# Patient Record
Sex: Male | Born: 1985
Health system: Southern US, Community
[De-identification: ages and names within clinical notes are randomized; demographics above are authoritative.]

## PROBLEM LIST (undated history)

## (undated) DIAGNOSIS — R011 Cardiac murmur, unspecified: Secondary | ICD-10-CM

## (undated) HISTORY — DX: Cardiac murmur, unspecified: R01.1

---
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
## 2005-02-16 ENCOUNTER — Other Ambulatory Visit: Admission: RE | Admit: 2005-02-16 | Discharge: 2005-02-16 | Payer: Self-pay | Admitting: Dermatology

## 2015-03-10 ENCOUNTER — Emergency Department (HOSPITAL_COMMUNITY)
Admission: EM | Admit: 2015-03-10 | Discharge: 2015-03-11 | Disposition: A | Discharge status: Home / Self Care | Payer: 59 | Attending: Emergency Medicine | Admitting: Emergency Medicine

## 2015-03-10 ENCOUNTER — Encounter (HOSPITAL_COMMUNITY): Payer: Self-pay | Admitting: Emergency Medicine

## 2015-03-10 DIAGNOSIS — R011 Cardiac murmur, unspecified: Secondary | ICD-10-CM | POA: Diagnosis not present

## 2015-03-10 DIAGNOSIS — M25562 Pain in left knee: Secondary | ICD-10-CM

## 2015-03-10 MED ORDER — HYDROCODONE-ACETAMINOPHEN 5-325 MG PO TABS
1.0000 | ORAL_TABLET | Freq: Once | ORAL | Status: AC
  Administered 2015-03-11: 1 via ORAL
  Filled 2015-03-10: qty 1

## 2015-03-10 NOTE — ED Notes (Signed)
Pt st's after going on a walk tonight he developed a sharp pain behind left knee then left leg started to feel numb.

## 2015-03-10 NOTE — ED Provider Notes (Signed)
CSN: [id number]     Arrival date & time 03/10/15  1931 History  This chart was scribed for Merryl Hacker, MD by Rayfield Citizen, ED Scribe. This patient was seen in room D30C/D30C and the patient's care was started at 11:41 PM.    Chief Complaint  Patient presents with  . Knee Pain   The history is provided by the patient. No language interpreter was used.     HPI Comments: Eugene Aguilar is an otherwise healthy 29 y.o. male who presents to the Emergency Department complaining of sudden onset, "sharp and tight" pain behind his left knee and radiating up both sides of the knee that began while bending downward tonight after a walk. He rates his pain as a 4-5/10 at present; he notes some numbness to the lower leg that has gradually improved with time. His discomfort is unaffected with movement of the knee; he has been able to bear weight since the incident. No treatments or medications tried PTA. Patient denies back pain. He denies a personal history of blood clots.   No daily medications, no known allergies.   History reviewed. No pertinent past medical history. History reviewed. No pertinent past surgical history. No family history on file. History  Substance Use Topics  . Smoking status: Never Smoker   . Smokeless tobacco: Not on file  . Alcohol Use: Yes    Review of Systems  Musculoskeletal:       Left knee pain  Neurological: Positive for numbness. Negative for weakness.  All other systems reviewed and are negative.     Allergies  Review of patient's allergies indicates no known allergies.  Home Medications   Prior to Admission medications   Medication Sig Start Date End Date Taking? Authorizing Provider  HYDROcodone-acetaminophen (NORCO/VICODIN) 5-325 MG per tablet Take 1 tablet by mouth every 6 (six) hours as needed for moderate pain. 03/11/15   Merryl Hacker, MD  ibuprofen (ADVIL,MOTRIN) 600 MG tablet Take 1 tablet (600 mg total) by mouth every 6 (six) hours as  needed. 03/11/15   Merryl Hacker, MD   BP 116/77 mmHg  Pulse 67  Temp(Src) 98.4 F (36.9 C) (Oral)  Resp 18  SpO2 97% Physical Exam  Constitutional: He is oriented to person, place, and time. He appears well-developed and well-nourished.  HENT:  Head: Normocephalic and atraumatic.  Cardiovascular: Normal rate and regular rhythm.   Murmur heard. Pulmonary/Chest: Effort normal and breath sounds normal. No respiratory distress. He has no wheezes.  Musculoskeletal: He exhibits no edema.  Tenderness to palpation of the left popliteal fossa, mild fullness noted, no asymmetric swelling, no joint line tenderness, no laxity of the joint, no obvious effusion, no overlying skin changes  Lymphadenopathy:    He has no cervical adenopathy.  Neurological: He is alert and oriented to person, place, and time.  Skin: Skin is warm and dry.  Psychiatric: He has a normal mood and affect.  Nursing note and vitals reviewed.   ED Course  Procedures   DIAGNOSTIC STUDIES: Oxygen Saturation is 100% on RA, normal by my interpretation.    COORDINATION OF CARE: 11:46 PM Discussed treatment plan with pt at bedside and pt agreed to plan.   Labs Review Labs Reviewed  D-DIMER, QUANTITATIVE (NOT AT [facility])    Imaging Review Dg Knee Complete 4 Views Left  03/11/2015   CLINICAL DATA:  Left knee pain. No known injury. Unable to bear weight.  EXAM: LEFT KNEE - COMPLETE 4+ VIEW  COMPARISON:  None.  FINDINGS: No fracture or dislocation. The alignment and joint spaces are maintained. No joint effusion or focal soft tissue abnormality.  IMPRESSION: Negative.   Electronically Signed   By: Jeb Levering M.D.   On: 03/11/2015 00:56     EKG Interpretation None      MDM   Final diagnoses:  Left knee pain   Patient presents with left knee pain. Also reports transient sharp tingling sensation in the setting of pain. Nontoxic on exam. Exam is only notable for mild tenderness of the popliteal fossa. X-rays  negative. Screening d-dimer is reassuring. Suspect possible Baker's cyst.  Discuss with patient pain medication and anti-inflammatories.  Patient to follow with PCP if symptoms continue over the next several days.  After history, exam, and medical workup I feel the patient has been appropriately medically screened and is safe for discharge home. Pertinent diagnoses were discussed with the patient. Patient was given return precautions.  I personally performed the services described in this documentation, which was scribed in my presence. The recorded information has been reviewed and is accurate.      Merryl Hacker, MD 03/12/15 [phone]

## 2015-03-11 ENCOUNTER — Emergency Department (HOSPITAL_COMMUNITY): Payer: 59

## 2015-03-11 LAB — D-DIMER, QUANTITATIVE (NOT AT ARMC)

## 2015-03-11 MED ORDER — IBUPROFEN 600 MG PO TABS: 600.0000 mg | ORAL_TABLET | Freq: Four times a day (QID) | ORAL | Status: DC | PRN

## 2015-03-11 MED ORDER — HYDROCODONE-ACETAMINOPHEN 5-325 MG PO TABS: 1.0000 | ORAL_TABLET | Freq: Four times a day (QID) | ORAL | Status: DC | PRN

## 2015-03-11 NOTE — ED Notes (Signed)
Pt stable, ambulatory, states understanding of discharge instructions 

## 2015-03-11 NOTE — Discharge Instructions (Signed)
You were seen today for knee pain. Her x-ray is reassuring. Your screening test for blood clot is negative. You may have a Baker cyst.  Treatment is with anti-inflammatory medication and rest and ice as needed. Follow-up with her primary physician if symptoms do not improve.  Knee Pain The knee is the complex joint between your thigh and your lower leg. It is made up of bones, tendons, ligaments, and cartilage. The bones that make up the knee are:  The femur in the thigh.  The tibia and fibula in the lower leg.  The patella or kneecap riding in the groove on the lower femur. CAUSES  Knee pain is a common complaint with many causes. A few of these causes are:  Injury, such as:  A ruptured ligament or tendon injury.  Torn cartilage.  Medical conditions, such as:  Gout  Arthritis  Infections  Overuse, over training, or overdoing a physical activity. Knee pain can be minor or severe. Knee pain can accompany debilitating injury. Minor knee problems often respond well to self-care measures or get well on their own. More serious injuries may need medical intervention or even surgery. SYMPTOMS The knee is complex. Symptoms of knee problems can vary widely. Some of the problems are:  Pain with movement and weight bearing.  Swelling and tenderness.  Buckling of the knee.  Inability to straighten or extend your knee.  Your knee locks and you cannot straighten it.  Warmth and redness with pain and fever.  Deformity or dislocation of the kneecap. DIAGNOSIS  Determining what is wrong may be very straight forward such as when there is an injury. It can also be challenging because of the complexity of the knee. Tests to make a diagnosis may include:  Your caregiver taking a history and doing a physical exam.  Routine X-rays can be used to rule out other problems. X-rays will not reveal a cartilage tear. Some injuries of the knee can be diagnosed by:  Arthroscopy a surgical  technique by which a small video camera is inserted through tiny incisions on the sides of the knee. This procedure is used to examine and repair internal knee joint problems. Tiny instruments can be used during arthroscopy to repair the torn knee cartilage (meniscus).  Arthrography is a radiology technique. A contrast liquid is directly injected into the knee joint. Internal structures of the knee joint then become visible on X-ray film.  An MRI scan is a non X-ray radiology procedure in which magnetic fields and a computer produce two- or three-dimensional images of the inside of the knee. Cartilage tears are often visible using an MRI scanner. MRI scans have largely replaced arthrography in diagnosing cartilage tears of the knee.  Blood work.  Examination of the fluid that helps to lubricate the knee joint (synovial fluid). This is done by taking a sample out using a needle and a syringe. TREATMENT The treatment of knee problems depends on the cause. Some of these treatments are:  Depending on the injury, proper casting, splinting, surgery, or physical therapy care will be needed.  Give yourself adequate recovery time. Do not overuse your joints. If you begin to get sore during workout routines, back off. Slow down or do fewer repetitions.  For repetitive activities such as cycling or running, maintain your strength and nutrition.  Alternate muscle groups. For example, if you are a weight lifter, work the upper body on one day and the lower body the next.  Either tight or weak muscles  do not give the proper support for your knee. Tight or weak muscles do not absorb the stress placed on the knee joint. Keep the muscles surrounding the knee strong.  Take care of mechanical problems.  If you have flat feet, orthotics or special shoes may help. See your caregiver if you need help.  Arch supports, sometimes with wedges on the inner or outer aspect of the heel, can help. These can shift  pressure away from the side of the knee most bothered by osteoarthritis.  A brace called an "unloader" brace also may be used to help ease the pressure on the most arthritic side of the knee.  If your caregiver has prescribed crutches, braces, wraps or ice, use as directed. The acronym for this is PRICE. This means protection, rest, ice, compression, and elevation.  Nonsteroidal anti-inflammatory drugs (NSAIDs), can help relieve pain. But if taken immediately after an injury, they may actually increase swelling. Take NSAIDs with food in your stomach. Stop them if you develop stomach problems. Do not take these if you have a history of ulcers, stomach pain, or bleeding from the bowel. Do not take without your caregiver's approval if you have problems with fluid retention, heart failure, or kidney problems.  For ongoing knee problems, physical therapy may be helpful.  Glucosamine and chondroitin are over-the-counter dietary supplements. Both may help relieve the pain of osteoarthritis in the knee. These medicines are different from the usual anti-inflammatory drugs. Glucosamine may decrease the rate of cartilage destruction.  Injections of a corticosteroid drug into your knee joint may help reduce the symptoms of an arthritis flare-up. They may provide pain relief that lasts a few months. You may have to wait a few months between injections. The injections do have a small increased risk of infection, water retention, and elevated blood sugar levels.  Hyaluronic acid injected into damaged joints may ease pain and provide lubrication. These injections may work by reducing inflammation. A series of shots may give relief for as long as 6 months.  Topical painkillers. Applying certain ointments to your skin may help relieve the pain and stiffness of osteoarthritis. Ask your pharmacist for suggestions. Many over the-counter products are approved for temporary relief of arthritis pain.  In some countries,  doctors often prescribe topical NSAIDs for relief of chronic conditions such as arthritis and tendinitis. A review of treatment with NSAID creams found that they worked as well as oral medications but without the serious side effects. PREVENTION  Maintain a healthy weight. Extra pounds put more strain on your joints.  Get strong, stay limber. Weak muscles are a common cause of knee injuries. Stretching is important. Include flexibility exercises in your workouts.  Be smart about exercise. If you have osteoarthritis, chronic knee pain or recurring injuries, you may need to change the way you exercise. This does not mean you have to stop being active. If your knees ache after jogging or playing basketball, consider switching to swimming, water aerobics, or other low-impact activities, at least for a few days a week. Sometimes limiting high-impact activities will provide relief.  Make sure your shoes fit well. Choose footwear that is right for your sport.  Protect your knees. Use the proper gear for knee-sensitive activities. Use kneepads when playing volleyball or laying carpet. Buckle your seat belt every time you drive. Most shattered kneecaps occur in car accidents.  Rest when you are tired. SEEK MEDICAL CARE IF:  You have knee pain that is continual and does not seem  to be getting better.  SEEK IMMEDIATE MEDICAL CARE IF:  Your knee joint feels hot to the touch and you have a high fever. MAKE SURE YOU:   Understand these instructions.  Will watch your condition.  Will get help right away if you are not doing well or get worse. Document Released: 06/26/2007 Document Revised: 11/21/2011 Document Reviewed: 06/26/2007 [facility] Patient Information 2015 [place], [place]. This information is not intended to replace advice given to you by your health care provider. Make sure you discuss any questions you have with your health care provider.

## 2016-03-01 ENCOUNTER — Encounter: Payer: Self-pay | Admitting: Cardiovascular Disease

## 2016-03-01 ENCOUNTER — Ambulatory Visit (INDEPENDENT_AMBULATORY_CARE_PROVIDER_SITE_OTHER): Payer: 59 | Admitting: Cardiovascular Disease

## 2016-03-01 ENCOUNTER — Encounter (INDEPENDENT_AMBULATORY_CARE_PROVIDER_SITE_OTHER): Payer: 59

## 2016-03-01 VITALS — BP 112/70 | HR 76 | Ht 69.0 in | Wt 143.8 lb

## 2016-03-01 DIAGNOSIS — R002 Palpitations: Secondary | ICD-10-CM

## 2016-03-01 DIAGNOSIS — Q210 Ventricular septal defect: Secondary | ICD-10-CM

## 2016-03-01 DIAGNOSIS — R0789 Other chest pain: Secondary | ICD-10-CM

## 2016-03-01 DIAGNOSIS — R079 Chest pain, unspecified: Secondary | ICD-10-CM

## 2016-03-01 DIAGNOSIS — Z0000 Encounter for general adult medical examination without abnormal findings: Secondary | ICD-10-CM

## 2016-03-01 NOTE — Assessment & Plan Note (Signed)
History of VSD which was a congenital heart defect from birth. He was seeing a pH of cardiologist at [facility]. He was very active and ran track at [employer]. He did have one episode of atypical chest pain but denies further episodes. Also denies shortness of breath. He briefly started running again.

## 2016-03-01 NOTE — Patient Instructions (Signed)
Medication Instructions:  Your physician recommends that you continue on your current medications as directed. Please refer to the Current Medication list given to you today.   Labwork: Your physician recommends that you return for lab work TODAY.   Testing/Procedures: Your physician has recommended that you wear an event monitor. Event monitors are medical devices that record the heart's electrical activity. Doctors most often [place] these monitors to diagnose arrhythmias. Arrhythmias are problems with the speed or rhythm of the heartbeat. The monitor is a small, portable device. You can wear one while you do your normal daily activities. This is usually used to diagnose what is causing palpitations/syncope (passing out).  Your physician has requested that you have an echocardiogram. Echocardiography is a painless test that uses sound waves to create images of your heart. It provides your doctor with information about the size and shape of your heart and how well your heart's chambers and valves are working. This procedure takes approximately one hour. There are no restrictions for this procedure.    Follow-Up: Your physician recommends that you schedule a follow-up appointment in: ABOUT 6 WEEKS AFTER YOUR ECHO AND EVENT MONITOR.   Any Other Special Instructions Will Be Listed Below (If Applicable).     If you need a refill on your cardiac medications before your next appointment, please call your pharmacy.

## 2016-03-01 NOTE — Assessment & Plan Note (Signed)
Recent onset of palpitations the last several weeks with presyncope.We will check routine lab work as well as a 30 day event monitor to further evaluate

## 2016-03-01 NOTE — Progress Notes (Signed)
     03/01/2016 Eugene Aguilar   05/06/1986  [id number]  Primary Physician No PCP Per Patient Primary Cardiologist: Lorretta Harp MD Renae Gloss  HPI:  Eugene Aguilar is a very pleasant 30 year old thin and fit-appearing married Caucasian male who is referred for evaluation of palpitations and chest pain. He works in the [employer] [place]. He has no cardiac risk factors. He was born with a VSD and was altered by PAS cardiologist at [facility]. He is very active and did run track in college. He briefly had an episode of atypical chest pain and has had residual low-grade chest discomfort along with episodic tachypalpitations with some associated presyncope.   No current outpatient prescriptions on file.   No current facility-administered medications for this visit.    No Known Allergies  Social History   Social History  . Marital Status: Single    Spouse Name: N/A  . Number of Children: N/A  . Years of Education: N/A   Occupational History  . Not on file.   Social History Main Topics  . Smoking status: Never Smoker   . Smokeless tobacco: Not on file  . Alcohol Use: Yes  . Drug Use: No  . Sexual Activity: Not on file   Other Topics Concern  . Not on file   Social History Narrative     Review of Systems: General: negative for chills, fever, night sweats or weight changes.  Cardiovascular: negative for chest pain, dyspnea on exertion, edema, orthopnea, palpitations, paroxysmal nocturnal dyspnea or shortness of breath Dermatological: negative for rash Respiratory: negative for cough or wheezing Urologic: negative for hematuria Abdominal: negative for nausea, vomiting, diarrhea, bright red blood per rectum, melena, or hematemesis Neurologic: negative for visual changes, syncope, or dizziness All other systems reviewed and are otherwise negative except as noted above.    Blood pressure 112/70, pulse 76, height 5\' 9"  (1.753 m), weight 143 lb  12.8 oz (65.227 kg).  General appearance: alert and no distress Neck: no adenopathy, no carotid bruit, no JVD, supple, symmetrical, trachea midline and thyroid not enlarged, symmetric, no tenderness/mass/nodules Lungs: clear to auscultation bilaterally Heart: 2 and fro systolic and diastolic "washing machine" murmur consistent with VSD Extremities: extremities normal, atraumatic, no cyanosis or edema  EKG normal sinus rhythm at 76 with an incomplete right bundle-branch block. I personally reviewed this EKG  ASSESSMENT AND PLAN:   Palpitations Recent onset of palpitations the last several weeks with presyncope.We will check routine lab work as well as a 30 day event monitor to further evaluate  VSD (ventricular septal defect) History of VSD which was a congenital heart defect from birth. He was seeing a pH of cardiologist at [facility]. He was very active and ran track at [employer]. He did have one episode of atypical chest pain but denies further episodes. Also denies shortness of breath. He briefly started running again.      Lorretta Harp MD FACP,FACC,FAHA, [facility] 03/01/2016 2:13 PM

## 2016-03-02 LAB — CBC WITH DIFFERENTIAL/PLATELET
Basophils Absolute: 42 cells/uL (ref 0–200)
Basophils Relative: 1 %
EOS ABS: 84 {cells}/uL (ref 15–500)
Eosinophils Relative: 2 %
HEMATOCRIT: 47.2 % (ref 38.5–50.0)
Hemoglobin: 15.6 g/dL (ref 13.2–17.1)
LYMPHS PCT: 33 %
Lymphs Abs: 1386 cells/uL (ref 850–3900)
MCH: 29.4 pg (ref 27.0–33.0)
MCHC: 33.1 g/dL (ref 32.0–36.0)
MCV: 89.1 fL (ref 80.0–100.0)
MONO ABS: 294 {cells}/uL (ref 200–950)
MPV: 8.7 fL (ref 7.5–12.5)
Monocytes Relative: 7 %
NEUTROS PCT: 57 %
Neutro Abs: 2394 cells/uL (ref 1500–7800)
Platelets: 196 10*3/uL (ref 140–400)
RBC: 5.3 MIL/uL (ref 4.20–5.80)
RDW: 13.4 % (ref 11.0–15.0)
WBC: 4.2 10*3/uL (ref 3.8–10.8)

## 2016-03-02 LAB — BASIC METABOLIC PANEL
BUN: 13 mg/dL (ref 7–25)
CO2: 31 mmol/L (ref 20–31)
Calcium: 9.5 mg/dL (ref 8.6–10.3)
Chloride: 102 mmol/L (ref 98–110)
Creat: 1.17 mg/dL (ref 0.60–1.35)
GLUCOSE: 68 mg/dL (ref 65–99)
POTASSIUM: 4.1 mmol/L (ref 3.5–5.3)
Sodium: 141 mmol/L (ref 135–146)

## 2016-03-02 LAB — T4, FREE: FREE T4: 1.3 ng/dL (ref 0.8–1.8)

## 2016-03-02 LAB — TSH: TSH: 1.23 mIU/L (ref 0.40–4.50)

## 2016-03-09 ENCOUNTER — Ambulatory Visit (HOSPITAL_COMMUNITY): Payer: 59 | Attending: Cardiology

## 2016-03-09 ENCOUNTER — Other Ambulatory Visit: Payer: Self-pay

## 2016-03-09 DIAGNOSIS — Q210 Ventricular septal defect: Secondary | ICD-10-CM | POA: Diagnosis not present

## 2016-03-09 LAB — ECHOCARDIOGRAM COMPLETE
AVLVOTPG: 3 mmHg
EERAT: 4.35
EWDT: 225 ms
FS: 34 % (ref 28–44)
IVS/LV PW RATIO, ED: 0.97
LA diam end sys: 27 mm
LA diam index: 1.52 cm/m2
LA vol A4C: 27 ml
LASIZE: 27 mm
LAVOL: 32 mL
LAVOLIN: 18 mL/m2
LV E/e' medial: 4.35
LV PW d: 8.68 mm — AB (ref 0.6–1.1)
LV e' LATERAL: 18.2 cm/s
LVEEAVG: 4.35
LVOT SV: 91 mL
LVOT VTI: 17.2 cm
LVOT area: 5.31 cm2
LVOT diameter: 26 mm
LVOTPV: 83.1 cm/s
MV Dec: 225
MV Peak grad: 3 mmHg
MVPKAVEL: 60.6 m/s
MVPKEVEL: 79.2 m/s
Reg peak vel: 243 cm/s
TDI e' lateral: 18.2
TDI e' medial: 12.7
TRMAXVEL: 243 cm/s

## 2016-03-11 ENCOUNTER — Ambulatory Visit: Payer: 59 | Admitting: Cardiovascular Disease

## 2016-04-20 ENCOUNTER — Telehealth: Payer: Self-pay | Admitting: Cardiovascular Disease

## 2016-04-20 NOTE — Telephone Encounter (Signed)
Returned call to patient and gave him results. Notes Recorded by Lorretta Harp, MD on 04/18/2016 at 1:09 PM EDT SR/ST  He verbalized understanding.

## 2016-04-20 NOTE — Telephone Encounter (Signed)
Mr. Eugene Aguilar is returning a call . Please call   Thanks

## 2016-04-26 ENCOUNTER — Encounter (INDEPENDENT_AMBULATORY_CARE_PROVIDER_SITE_OTHER): Payer: Self-pay

## 2016-04-26 ENCOUNTER — Encounter: Payer: Self-pay | Admitting: Cardiovascular Disease

## 2016-04-26 ENCOUNTER — Ambulatory Visit (INDEPENDENT_AMBULATORY_CARE_PROVIDER_SITE_OTHER): Payer: 59 | Admitting: Cardiovascular Disease

## 2016-04-26 DIAGNOSIS — R002 Palpitations: Secondary | ICD-10-CM | POA: Diagnosis not present

## 2016-04-26 DIAGNOSIS — Q210 Ventricular septal defect: Secondary | ICD-10-CM | POA: Diagnosis not present

## 2016-04-26 NOTE — Patient Instructions (Signed)
Your physician recommends that you schedule a follow-up appointment ON AN AS NEEDED BASIS.

## 2016-04-26 NOTE — Assessment & Plan Note (Signed)
Eugene Aguilar returns for follow-up of palpitations. His monitor showed sinus rhythm/sinus tachycardia without tachycardia or bradycardia arrhythmias. He did describe his initial symptom was sounded like PSVT. It awakened her from sleep, lasted for 5 hours and rapid offset afterward she felt fatigued. He has not had recurrent symptoms. His thyroid function tests were normal. If he has a recurrent episode he will need a loop recorder implantation.

## 2016-04-26 NOTE — Assessment & Plan Note (Signed)
History of VSD with 2-D echo that showed a small. Membranous VSD with normal LV size and function are normal RV size and function with normal pulmonary artery pressures.

## 2016-04-26 NOTE — Progress Notes (Signed)
Eugene Aguilar returns today for follow-up of his 2-D echo and event monitor. Monitor showed only sinus rhythm/sinus tachycardia without abnormal rhythms. His 2-D echo was entirely normal with demonstration of a small perimembranous VSD. I reassured him that in probable that he will have a recurrent episode but if he does need a loop recorder implanted further determine the nature of this arrhythmia. I'll see him back as needed.   Lorretta Harp, M.D., [place], [facility], [place] [address] [place], [place]  [id number]  [phone] 04/26/2016 8:28 AM

## 2016-07-06 ENCOUNTER — Telehealth: Payer: Self-pay | Admitting: Cardiovascular Disease

## 2016-07-06 NOTE — Telephone Encounter (Signed)
Called patient and gave him some recommendations for PCP.

## 2016-07-06 NOTE — Telephone Encounter (Signed)
New message     Patient is looking for a referral to PCP in [place].

## 2016-07-06 NOTE — Telephone Encounter (Signed)
Please advise if any recommendations/preferences for PCP.

## 2016-07-13 ENCOUNTER — Telehealth: Payer: Self-pay | Admitting: Cardiovascular Disease

## 2016-07-13 NOTE — Telephone Encounter (Signed)
Patient states he has had lingering, dull chest pain for a while.  It has gotten worse today and when he eats, he gets lightheaded.  Please call

## 2016-07-13 NOTE — Telephone Encounter (Signed)
Patient reporting dull pain, intermittent nausea x4-5 weeks States not worse exertionally. Discomfort mainly in upper chest area (just below throat), exacerbated w eating (states dizziness, nausea but no loss of appetite). He notes could be GERD, has not gone to ED for evaluation. Asked about any alleviation of his symptoms. He's cut out caffeine & drinking more water, notes no or little improvement.  Advised to go to ED if pain or dizziness worsens, or if he has accompanying symptoms such as SOB, arm or neck pain. Will see APP tomorrow.

## 2016-07-14 ENCOUNTER — Ambulatory Visit (INDEPENDENT_AMBULATORY_CARE_PROVIDER_SITE_OTHER): Payer: 59 | Admitting: Cardiology

## 2016-07-14 ENCOUNTER — Encounter: Payer: Self-pay | Admitting: Cardiology

## 2016-07-14 VITALS — BP 113/67 | HR 76 | Ht 69.0 in | Wt 143.6 lb

## 2016-07-14 DIAGNOSIS — R002 Palpitations: Secondary | ICD-10-CM | POA: Diagnosis not present

## 2016-07-14 DIAGNOSIS — R0789 Other chest pain: Secondary | ICD-10-CM | POA: Diagnosis not present

## 2016-07-14 DIAGNOSIS — Q210 Ventricular septal defect: Secondary | ICD-10-CM

## 2016-07-14 MED ORDER — OMEPRAZOLE 20 MG PO CPDR: 20.0000 mg | DELAYED_RELEASE_CAPSULE | Freq: Every day | ORAL | 11 refills | Status: DC

## 2016-07-14 NOTE — Patient Instructions (Signed)
Medication Instructions:  START OMEPRAZOLE 20MG  IN THE MORNING 30 MINUTES BEFORE BREAKFAST  Labwork: NONE  Follow-Up: Your physician recommends that you schedule a follow-up appointment in: [place]   If you need a refill on your cardiac medications before your next appointment, please call your pharmacy.

## 2016-07-14 NOTE — Assessment & Plan Note (Signed)
Pt here because he has had non exertional epigastric discomfort

## 2016-07-14 NOTE — Progress Notes (Signed)
    07/14/2016 Arna Snipe Eugene Aguilar   Aug 28, 1986  [id number]  Primary Physician No PCP Per Patient Primary Cardiologist: Dr Gwenlyn Found  HPI:  30 year old thin and fit-appearing married Caucasian male who is followed by Dr Gwenlyn Found. He was born with a VSD and was followed by a cardiologist at [facility]. He is very active and did run track at [employer] 2006-2010. He has had palpitations in the past, a Holter showed sinus tach in June 2017. Echo in June showed a small perimembranous VSD He takes no medication. He is in the office today with complaints of epigastric discomfort. The pt describes SSCP when his stomach is empty. He occasionally has dizziness when eating. He denies any exertional symptoms and walks/runs 3 x week. He denies any trouble swallowing. He denies any dyspnea.    Current Outpatient Prescriptions  Medication Sig Dispense Refill  . omeprazole (PRILOSEC) 20 MG capsule Take 1 capsule (20 mg total) by mouth daily. 30 capsule 11   No current facility-administered medications for this visit.     No Known Allergies  Social History   Social History  . Marital status: Married    Spouse name: N/A  . Number of children: N/A  . Years of education: N/A   Occupational History  . Not on file.   Social History Main Topics  . Smoking status: Never Smoker  . Smokeless tobacco: Not on file  . Alcohol use Yes  . Drug use: No  . Sexual activity: Not on file   Other Topics Concern  . Not on file   Social History Narrative  . No narrative on file     Review of Systems: General: negative for chills, fever, night sweats or weight changes.  Cardiovascular: negative for chest pain, dyspnea on exertion, edema, orthopnea, palpitations, paroxysmal nocturnal dyspnea or shortness of breath Dermatological: negative for rash Respiratory: negative for cough or wheezing Urologic: negative for hematuria Abdominal: negative for nausea, vomiting, diarrhea, bright red blood per rectum, melena,  or hematemesis Neurologic: negative for visual changes, syncope, or dizziness All other systems reviewed and are otherwise negative except as noted above.    Blood pressure 113/67, pulse 76, height 5\' 9"  (1.753 m), weight 143 lb 9.6 oz (65.1 kg), SpO2 99 %.  General appearance: alert, cooperative, no distress and thin Neck: no carotid bruit and no JVD Lungs: clear to auscultation bilaterally Heart: regular rate and rhythm and 2/6 mid systolic murmur Extremities: extremities normal, atraumatic, no cyanosis or edema Skin: Skin color, texture, turgor normal. No rashes or lesions Neurologic: Grossly normal  EKG NSR, NSST changes secondary to repol  ASSESSMENT AND PLAN:   Chest discomfort Pt here because he has had non exertional epigastric discomfort  Palpitations One episode of palpations since LOV- very brief  VSD (ventricular septal defect) Last echo June 2017   PLAN  He has no risk factors for CAD and his symptoms are more typical of GERD. I suggested we try Omeprazole 20 mg Q am. He'll call if his symptoms don't improve.   Kerin Ransom PA-C 07/14/2016 12:03 PM

## 2016-07-14 NOTE — Assessment & Plan Note (Signed)
One episode of palpations since LOV- very brief

## 2016-07-14 NOTE — Assessment & Plan Note (Signed)
Last echo June 2017

## 2016-12-28 DIAGNOSIS — L814 Other melanin hyperpigmentation: Secondary | ICD-10-CM | POA: Diagnosis not present

## 2016-12-28 DIAGNOSIS — D1800 Hemangioma unspecified site: Secondary | ICD-10-CM | POA: Diagnosis not present

## 2016-12-28 DIAGNOSIS — D225 Melanocytic nevi of trunk: Secondary | ICD-10-CM | POA: Diagnosis not present

## 2017-01-09 DIAGNOSIS — R600 Localized edema: Secondary | ICD-10-CM | POA: Diagnosis not present

## 2017-01-09 DIAGNOSIS — Z6820 Body mass index (BMI) 20.0-20.9, adult: Secondary | ICD-10-CM | POA: Diagnosis not present

## 2017-01-09 DIAGNOSIS — M79605 Pain in left leg: Secondary | ICD-10-CM | POA: Diagnosis not present

## 2017-01-09 DIAGNOSIS — R1032 Left lower quadrant pain: Secondary | ICD-10-CM | POA: Diagnosis not present

## 2017-03-14 DIAGNOSIS — Z0000 Encounter for general adult medical examination without abnormal findings: Secondary | ICD-10-CM | POA: Diagnosis not present

## 2017-03-20 DIAGNOSIS — Z23 Encounter for immunization: Secondary | ICD-10-CM | POA: Diagnosis not present

## 2017-03-20 DIAGNOSIS — Z6820 Body mass index (BMI) 20.0-20.9, adult: Secondary | ICD-10-CM | POA: Diagnosis not present

## 2017-03-20 DIAGNOSIS — Z0000 Encounter for general adult medical examination without abnormal findings: Secondary | ICD-10-CM | POA: Diagnosis not present

## 2017-07-07 DIAGNOSIS — Z23 Encounter for immunization: Secondary | ICD-10-CM | POA: Diagnosis not present

## 2017-11-23 DIAGNOSIS — J019 Acute sinusitis, unspecified: Secondary | ICD-10-CM | POA: Diagnosis not present

## 2017-12-07 DIAGNOSIS — J208 Acute bronchitis due to other specified organisms: Secondary | ICD-10-CM | POA: Diagnosis not present

## 2018-01-05 DIAGNOSIS — J45909 Unspecified asthma, uncomplicated: Secondary | ICD-10-CM | POA: Diagnosis not present

## 2018-01-05 DIAGNOSIS — R05 Cough: Secondary | ICD-10-CM | POA: Diagnosis not present

## 2018-01-05 DIAGNOSIS — J302 Other seasonal allergic rhinitis: Secondary | ICD-10-CM | POA: Diagnosis not present

## 2018-07-28 DIAGNOSIS — Z23 Encounter for immunization: Secondary | ICD-10-CM | POA: Diagnosis not present

## 2018-11-05 DIAGNOSIS — Z23 Encounter for immunization: Secondary | ICD-10-CM | POA: Diagnosis not present

## 2018-11-05 DIAGNOSIS — D225 Melanocytic nevi of trunk: Secondary | ICD-10-CM | POA: Diagnosis not present

## 2018-12-06 ENCOUNTER — Other Ambulatory Visit: Payer: Self-pay | Admitting: Internal Medicine

## 2018-12-06 ENCOUNTER — Other Ambulatory Visit (HOSPITAL_COMMUNITY): Payer: Self-pay | Admitting: Internal Medicine

## 2018-12-06 DIAGNOSIS — R002 Palpitations: Secondary | ICD-10-CM | POA: Diagnosis not present

## 2018-12-06 DIAGNOSIS — Q210 Ventricular septal defect: Secondary | ICD-10-CM | POA: Diagnosis not present

## 2018-12-06 DIAGNOSIS — R1110 Vomiting, unspecified: Secondary | ICD-10-CM | POA: Diagnosis not present

## 2018-12-07 ENCOUNTER — Other Ambulatory Visit: Payer: Self-pay

## 2018-12-07 ENCOUNTER — Telehealth: Payer: Self-pay | Admitting: Cardiovascular Disease

## 2018-12-07 ENCOUNTER — Other Ambulatory Visit: Payer: Self-pay | Admitting: Internal Medicine

## 2018-12-07 ENCOUNTER — Ambulatory Visit
Admission: RE | Admit: 2018-12-07 | Discharge: 2018-12-07 | Disposition: A | Discharge status: Home / Self Care | Payer: 59 | Source: Ambulatory Visit | Attending: Internal Medicine | Admitting: Internal Medicine

## 2018-12-07 DIAGNOSIS — R002 Palpitations: Secondary | ICD-10-CM

## 2018-12-07 DIAGNOSIS — S0990XA Unspecified injury of head, initial encounter: Secondary | ICD-10-CM

## 2018-12-07 DIAGNOSIS — R1110 Vomiting, unspecified: Secondary | ICD-10-CM

## 2018-12-07 DIAGNOSIS — R42 Dizziness and giddiness: Secondary | ICD-10-CM | POA: Diagnosis not present
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
## 2018-12-07 NOTE — Telephone Encounter (Signed)
The symptoms do not sound primarily cardiac nor do they sound urgent.  They sound more gastrointestinal.  I think for that episode he can probably follow-up with his PCP, wait for the 2D echo and be seen after that.

## 2018-12-07 NOTE — Telephone Encounter (Signed)
Patient c/o Palpitations:  High priority if patient c/o lightheadedness, shortness of breath, or chest pain  1) How long have you had palpitations/irregular HR/ Afib? Are you having the symptoms now? Started on 2) Wednesday, not at this time  3) Are you currently experiencing lightheadedness, SOB or CP? Preessure in his chest-now 4) Do you have a history of afib (atrial fibrillation) or irregular heart rhythm? no  5) Have you checked your BP or HR? (document readings if available): 117/70 -heart rate yesterday was 103  6) Are you experiencing any other symptoms?real tired, nauseated at timeshands and feet are cold to the touch,

## 2018-12-07 NOTE — Telephone Encounter (Signed)
Advised patient, verbalized understanding. Scheduled follow up, aware could change to virtual secondary to COVID 19

## 2018-12-27 ENCOUNTER — Telehealth: Payer: Self-pay | Admitting: Internal Medicine

## 2018-12-27 NOTE — Telephone Encounter (Signed)
Reviewed chart for echo orderded    Pt called in end of march   ? GI symptoms NOt sure echo still indicated since symptoms did not appear to be cardiac Will forward to Adora Fridge for review Pt to hold coming for echo until verified

## 2018-12-27 NOTE — Telephone Encounter (Signed)
I haven't seen this pt since 2017. Not sure who ordered 2D or why

## 2018-12-28 NOTE — Telephone Encounter (Signed)
Echo ordered on 12/06/2018 by a Dr. Velna Hatchet with indication listed as "Palpitations"

## 2018-12-29 NOTE — Telephone Encounter (Signed)
Pt had a 2D echo performed in 2017 that was essentially nl except small congenital VSD. This is NOT an urgent echo and can definitely be delayed.

## 2018-12-31 ENCOUNTER — Ambulatory Visit (HOSPITAL_COMMUNITY): Payer: 59
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
## 2019-01-01 ENCOUNTER — Telehealth: Payer: Self-pay | Admitting: *Deleted

## 2019-01-01 NOTE — Telephone Encounter (Signed)
   Cardiac Questionnaire:    Since your last visit or hospitalization:    1. Have you been having new or worsening chest pain? NO   2. Have you been having new or worsening shortness of breath? NO 3. Have you been having new or worsening leg swelling, wt gain, or increase in abdominal girth (pants fitting more tightly)? NO   4. Have you had any passing out spells? NO    *A YES to any of these questions would result in the appointment being kept. *If all the answers to these questions are NO, we should indicate that given the current situation regarding the worldwide coronarvirus pandemic, at the recommendation of the CDC, we are looking to limit gatherings in our waiting area, and thus will reschedule their appointment beyond four weeks from today.   _____________   PYPPJ-09 Pre-Screening Questions:  . Do you currently have a fever?NO . Have you recently travelled on a cruise, internationally, or to [place], [place], [place], [place], [place], or [place], [place] [employer])? NO . Have you been in contact with someone that is currently pending confirmation of Covid19 testing or has been confirmed to have the [place] virus?  NO . Are you currently experiencing fatigue or cough? NO       Spoke with patient and he agreed to reschedule his appointment to May 07, 2019 at 8:00 am with Dr. Gwenlyn Found.

## 2019-01-02 ENCOUNTER — Ambulatory Visit: Payer: 59 | Admitting: Cardiovascular Disease

## 2019-01-22 DIAGNOSIS — K219 Gastro-esophageal reflux disease without esophagitis: Secondary | ICD-10-CM | POA: Diagnosis not present

## 2019-02-27 ENCOUNTER — Telehealth (HOSPITAL_COMMUNITY): Payer: Self-pay | Admitting: Radiology

## 2019-02-27 NOTE — Telephone Encounter (Signed)
Left message to call office-Patient needs to schedule an echocardiogram.  

## 2019-03-07 ENCOUNTER — Telehealth (HOSPITAL_COMMUNITY): Payer: Self-pay | Admitting: Cardiology

## 2019-03-07 NOTE — Telephone Encounter (Signed)

## 2019-03-08 ENCOUNTER — Ambulatory Visit (HOSPITAL_COMMUNITY): Payer: 59 | Attending: Internal Medicine

## 2019-03-08 ENCOUNTER — Other Ambulatory Visit: Payer: Self-pay

## 2019-03-08 DIAGNOSIS — R002 Palpitations: Secondary | ICD-10-CM | POA: Diagnosis not present

## 2019-03-27 ENCOUNTER — Encounter: Payer: Self-pay | Admitting: Cardiology

## 2019-03-29 ENCOUNTER — Encounter: Payer: Self-pay | Admitting: Cardiology

## 2019-03-29 ENCOUNTER — Ambulatory Visit: Payer: 59 | Admitting: Cardiology

## 2019-03-29 ENCOUNTER — Other Ambulatory Visit: Payer: Self-pay

## 2019-03-29 VITALS — BP 118/82 | HR 88 | Ht 70.0 in | Wt 140.3 lb

## 2019-03-29 DIAGNOSIS — I471 Supraventricular tachycardia: Secondary | ICD-10-CM | POA: Diagnosis not present

## 2019-03-29 DIAGNOSIS — R002 Palpitations: Secondary | ICD-10-CM | POA: Diagnosis not present

## 2019-03-29 DIAGNOSIS — Q210 Ventricular septal defect: Secondary | ICD-10-CM | POA: Diagnosis not present

## 2019-03-29 NOTE — Progress Notes (Signed)
Primary Physician/Referring:  Velna Hatchet, MD  Patient ID: Eugene Aguilar, male    DOB: Jul 09, 1986, 33 y.o.   MRN: [id number]  Chief Complaint  Patient presents with  . Palpitations  . New Patient (Initial Visit)   HPI:    HPI: Eugene Aguilar  is a 33 y.o. male  With history of VSD since childhood, referred to me for evaluation of palpitations that occurred about 2 years ago, patient had an episode of rapid heartbeat about 2 years ago and subsided after few hours and was seen in the emergency room but had converted to sinus rhythm.  He is doing well until 3 months ago he had another episode of sudden onset rapid palpitations while he was just relaxing on the sofa, he tried Valsalva maneuver but would not respond, but due to Covid-19, although it lasted for about 5 hours he did not seek any attention, subsided spontaneously it left him very fatigued and tired.  He has not had any recurrence since then. Asymptomatic now.  He is fairly active, exercises regularly, he does not smoke cigarettes or drink excessive alcohol and there is no history of drug use.  No history of sudden cardiac death in the family.  Presently not on any medications.  He was told to have normal labs including lipids and TSH.  Past Medical History:  Diagnosis Date  . Heart murmur    VSD since childhood   History reviewed. No pertinent surgical history. Social History   Socioeconomic History  . Marital status: Married    Spouse name: Not on file  . Number of children: 1  . Years of education: Not on file  . Highest education level: Not on file  Occupational History  . Not on file  Social Needs  . Financial resource strain: Not on file  . Food insecurity    Worry: Not on file    Inability: Not on file  . Transportation needs    Medical: Not on file    Non-medical: Not on file  Tobacco Use  . Smoking status: Never Smoker  . Smokeless tobacco: Never Used  Substance and Sexual Activity  .  Alcohol use: Not Currently    Comment: quit march 2020  . Drug use: No  . Sexual activity: Never  Lifestyle  . Physical activity    Days per week: Not on file    Minutes per session: Not on file  . Stress: Not on file  Relationships  . Social Herbalist on phone: Not on file    Gets together: Not on file    Attends religious service: Not on file    Active member of club or organization: Not on file    Attends meetings of clubs or organizations: Not on file    Relationship status: Not on file  . Intimate partner violence    Fear of current or ex partner: Not on file    Emotionally abused: Not on file    Physically abused: Not on file    Forced sexual activity: Not on file  Other Topics Concern  . Not on file  Social History Narrative  . Not on file   ROS  Review of Systems  Constitution: Negative for chills, decreased appetite, malaise/fatigue and weight gain.  Cardiovascular: Negative for dyspnea on exertion, leg swelling and syncope.  Endocrine: Negative for cold intolerance.  Hematologic/Lymphatic: Does not bruise/bleed easily.  Musculoskeletal: Negative for joint swelling.  Gastrointestinal: Negative for abdominal  pain, anorexia, change in bowel habit, hematochezia and melena.  Neurological: Negative for headaches and light-headedness.  Psychiatric/Behavioral: Negative for depression and substance abuse.  All other systems reviewed and are negative.  Objective  Blood pressure 118/82, pulse 88, height 5\' 10"  (1.778 m), weight 140 lb 4.8 oz (63.6 kg), SpO2 98 %. Body mass index is 20.13 kg/m.   Physical Exam  Constitutional: He appears well-developed and well-nourished. No distress.  HENT:  Head: Atraumatic.  Eyes: Conjunctivae are normal.  Neck: Neck supple. No JVD present. No thyromegaly present.  Cardiovascular: Normal rate, regular rhythm, normal heart sounds and intact distal pulses. Exam reveals no gallop.  No murmur heard. Pulmonary/Chest: Effort  normal and breath sounds normal.  Abdominal: Soft. Bowel sounds are normal.  Musculoskeletal: Normal range of motion.  Neurological: He is alert.  Skin: Skin is warm and dry.  Psychiatric: He has a normal mood and affect.   Radiology: No results found.  Laboratory examination:   CMP Latest Ref Rng & Units 03/01/2016  Glucose 65 - 99 mg/dL 68  BUN 7 - 25 mg/dL 13  Creatinine 0.60 - 1.35 mg/dL 1.17  Sodium 135 - 146 mmol/L 141  Potassium 3.5 - 5.3 mmol/L 4.1  Chloride 98 - 110 mmol/L 102  CO2 20 - 31 mmol/L 31  Calcium 8.6 - 10.3 mg/dL 9.5   CBC Latest Ref Rng & Units 03/01/2016  WBC 3.8 - 10.8 K/uL 4.2  Hemoglobin 13.2 - 17.1 g/dL 15.6  Hematocrit 38.5 - 50.0 % 47.2  Platelets 140 - 400 K/uL 196   Lipid Panel  No results found for: CHOL, TRIG, HDL, CHOLHDL, VLDL, LDLCALC, LDLDIRECT HEMOGLOBIN A1C No results found for: HGBA1C, MPG TSH No results for input(s): TSH in the last 8760 hours. Medications   Current Outpatient Medications  Medication Instructions  . sucralfate (CARAFATE) 1 g, Oral, 2 times daily    Cardiac Studies:   Echocardiogram 03/08/2019 :  1. The left ventricle has normal systolic function with an ejection fraction of 60-65%. The cavity size was normal. Left ventricular diastolic parameters were normal. No evidence of left ventricular regional wall motion abnormalities.  2. Small membranous ventricular septal defect with left to right shunting.  3. The right ventricle has normal systolic function. The cavity was normal. There is no increase in right ventricular wall thickness.  4. The tricuspid valve is grossly normal.  5. The aortic valve is tricuspid. No stenosis of the aortic valve.  6. Cannot exclude small atrial septal defect.  7. The interatrial septum is aneurysmal.  8. The mitral valve is grossly normal.  9. When compared to the prior study: 03/09/2017: LVEF 55-60%, small perimembranous VSD.  Assessment     ICD-10-CM   1. Palpitations  R00.2  EKG 12-Lead  2. PSVT (paroxysmal supraventricular tachycardia) (HCC)  I47.1   3. Ventricular septal defect (VSD), membranous  Q21.0     EKG 03/29/2019: Normal sinus rhythm at rate of 84 bpm, normal axis, no evidence of ischemia, normal EKG.  Recommendations:   Patient referred to me for to establish cardiac care, due to congenital VSD, it is a small membranous defect which no therapy is indicated in the absence of symptoms of heart failure or RV strain.  No endocarditis prophylaxis is indicated.  His symptoms of palpitations are clearly indicated of PSVT. Valsalva strain: I have explained to the patient the mechanism of PSVT, explained Valsalva maneuver, straining, splashing cold water on the face.    Patient also advised to  go to the nearest medical facility to obtain a EKG to confirm presence of PSVT or activate EMS.  Also discussed if the usual maneuvers do not improve the symptoms to go to the emergency department. At this point as his symptoms of palpitations are very few and rare, watchful waiting is indicated.  He would like to continue to see me on a regular basis, I'll set him up to see me in 2 years.  I reviewed his echocardiogram.  Adrian Prows, MD, [facility] 03/30/2019, 12:48 PM [facility]. [place] Pager: [phone] Office: [phone] If no answer Cell [phone]

## 2019-03-30 ENCOUNTER — Encounter: Payer: Self-pay | Admitting: Cardiology

## 2019-04-19 ENCOUNTER — Telehealth: Payer: Self-pay

## 2019-04-19 NOTE — Telephone Encounter (Signed)
Spoke with pt regarding moving appt 05/07/2019 at 0800 to 0815. Pt states that he was denied an appt by [place] office and went on to have an echo done and f/u with another practice. He states he does not believe he will need an appointment now since another practice is following him. Pt would still like to know reason for denial. Informed pt that nurse would reach out to scheduling and billing to advise and update him with response. Pt verbalized understanding

## 2019-05-07 ENCOUNTER — Ambulatory Visit: Payer: 59 | Admitting: Cardiovascular Disease

## 2019-11-11 HISTORY — PX: COLONOSCOPY: SHX174

## 2019-11-18 ENCOUNTER — Ambulatory Visit
Admission: RE | Admit: 2019-11-18 | Discharge: 2019-11-18 | Disposition: A | Discharge status: Home / Self Care | Payer: 59 | Source: Ambulatory Visit | Attending: Gastroenterology | Admitting: Gastroenterology

## 2019-11-18 ENCOUNTER — Other Ambulatory Visit: Payer: Self-pay | Admitting: Gastroenterology

## 2019-11-18 ENCOUNTER — Other Ambulatory Visit: Payer: Self-pay

## 2019-11-18 DIAGNOSIS — R1030 Lower abdominal pain, unspecified: Secondary | ICD-10-CM

## 2019-11-25 ENCOUNTER — Other Ambulatory Visit: Payer: Self-pay | Admitting: Gastroenterology

## 2019-11-25 ENCOUNTER — Ambulatory Visit
Admission: RE | Admit: 2019-11-25 | Discharge: 2019-11-25 | Disposition: A | Discharge status: Home / Self Care | Payer: 59 | Source: Ambulatory Visit | Attending: Gastroenterology | Admitting: Gastroenterology

## 2019-11-25 ENCOUNTER — Other Ambulatory Visit: Payer: Self-pay

## 2019-11-25 ENCOUNTER — Inpatient Hospital Stay: Admission: RE | Admit: 2019-11-25 | Payer: 59 | Source: Ambulatory Visit

## 2019-11-25 DIAGNOSIS — R1084 Generalized abdominal pain: Secondary | ICD-10-CM

## 2019-11-25 DIAGNOSIS — R1013 Epigastric pain: Secondary | ICD-10-CM

## 2019-11-25 DIAGNOSIS — R1032 Left lower quadrant pain: Secondary | ICD-10-CM

## 2019-11-25 MED ORDER — IOPAMIDOL (ISOVUE-300) INJECTION 61%
100.0000 mL | Freq: Once | INTRAVENOUS | Status: AC | PRN
  Administered 2019-11-25: 100 mL via INTRAVENOUS

## 2019-12-31 ENCOUNTER — Ambulatory Visit: Payer: 59

## 2019-12-31 NOTE — Progress Notes (Deleted)
Primary Physician/Referring:  Velna Hatchet, MD  Patient ID: Eugene Aguilar, male    DOB: [birth year], 34 y.o.   MRN: [id number]  No chief complaint on file.  HPI:    HPI: Eugene Aguilar  is a 34 y.o. male  With history of small membranous VSD since childhood, PSVT that occurred sometime in 2019, was seen in the emergency room but had converted to sinus rhythm. He has had occasional breakthrough PSVT and now presents for a 6 month f/u.    He is fairly active, exercises regularly, he does not smoke cigarettes or drink excessive alcohol and there is no history of drug use.  No history of sudden cardiac death in the family.  Presently not on any medications.    Past Medical History:  Diagnosis Date  . Heart murmur    VSD since childhood   No past surgical history on file. Social History   Socioeconomic History  . Marital status: Married    Spouse name: Not on file  . Number of children: 1  . Years of education: Not on file  . Highest education level: Not on file  Occupational History  . Not on file  Tobacco Use  . Smoking status: Never Smoker  . Smokeless tobacco: Never Used  Substance and Sexual Activity  . Alcohol use: Not Currently    Comment: quit march 2020  . Drug use: No  . Sexual activity: Never  Other Topics Concern  . Not on file  Social History Narrative  . Not on file   Social Determinants of Health   Financial Resource Strain:   . Difficulty of Paying Living Expenses:   Food Insecurity:   . Worried About Charity fundraiser in the Last Year:   . Arboriculturist in the Last Year:   Transportation Needs:   . Film/video editor (Medical):   Marland Kitchen Lack of Transportation (Non-Medical):   Physical Activity:   . Days of Exercise per Week:   . Minutes of Exercise per Session:   Stress:   . Feeling of Stress :   Social Connections:   . Frequency of Communication with Friends and Family:   . Frequency of Social Gatherings with Friends and Family:    . Attends Religious Services:   . Active Member of Clubs or Organizations:   . Attends Archivist Meetings:   Marland Kitchen Marital Status:   Intimate Partner Violence:   . Fear of Current or Ex-Partner:   . Emotionally Abused:   Marland Kitchen Physically Abused:   . Sexually Abused:    ROS  Review of Systems  Cardiovascular: Negative for chest pain, dyspnea on exertion and leg swelling.  Gastrointestinal: Negative for melena.   Objective  There were no vitals taken for this visit. There is no height or weight on file to calculate BMI.   Physical Exam  Constitutional: He appears well-developed and well-nourished. No distress.  Cardiovascular: Normal rate, regular rhythm, normal heart sounds and intact distal pulses. Exam reveals no gallop.  No murmur heard. Pulmonary/Chest: Effort normal and breath sounds normal.  Abdominal: Soft. Bowel sounds are normal.  Musculoskeletal:        General: Normal range of motion.   Radiology: No results found.  Laboratory examination:   CMP Latest Ref Rng & Units 03/01/2016  Glucose 65 - 99 mg/dL 68  BUN 7 - 25 mg/dL 13  Creatinine 0.60 - 1.35 mg/dL 1.17  Sodium 135 - 146 mmol/L 141  Potassium 3.5 - 5.3 mmol/L 4.1  Chloride 98 - 110 mmol/L 102  CO2 20 - 31 mmol/L 31  Calcium 8.6 - 10.3 mg/dL 9.5   CBC Latest Ref Rng & Units 03/01/2016  WBC 3.8 - 10.8 K/uL 4.2  Hemoglobin 13.2 - 17.1 g/dL 15.6  Hematocrit 38.5 - 50.0 % 47.2  Platelets 140 - 400 K/uL 196   Lipid Panel  No results found for: CHOL, TRIG, HDL, CHOLHDL, VLDL, LDLCALC, LDLDIRECT HEMOGLOBIN A1C No results found for: HGBA1C, MPG TSH No results for input(s): TSH in the last 8760 hours. Medications   Current Outpatient Medications  Medication Instructions  . sucralfate (CARAFATE) 1 g, Oral, 2 times daily    Cardiac Studies:   Echocardiogram 03/08/2019 :  1. The left ventricle has normal systolic function with an ejection fraction of 60-65%. The cavity size was normal. Left  ventricular diastolic parameters were normal. No evidence of left ventricular regional wall motion abnormalities.  2. Small membranous ventricular septal defect with left to right shunting.  3. The right ventricle has normal systolic function. The cavity was normal. There is no increase in right ventricular wall thickness.  4. The tricuspid valve is grossly normal.  5. The aortic valve is tricuspid. No stenosis of the aortic valve.  6. Cannot exclude small atrial septal defect.  7. The interatrial septum is aneurysmal.  8. The mitral valve is grossly normal.  9. When compared to the prior study: 03/09/2017: LVEF 55-60%, small perimembranous VSD.  Assessment     ICD-10-CM   1. Palpitations  R00.2   2. PSVT (paroxysmal supraventricular tachycardia) (HCC)  I47.1   3. Ventricular septal defect (VSD), membranous  Q21.0     EKG 03/29/2019: Normal sinus rhythm at rate of 84 bpm, normal axis, no evidence of ischemia, normal EKG.  Recommendations:   Eugene Aguilar  is a 34 y.o. male  With history of small membranous VSD since childhood, PSVT that occurred sometime in 2019, was seen in the emergency room but had converted to sinus rhythm. He has had occasional breakthrough PSVT and now presents for a 6 month f/u.    His symptoms of palpitations are clearly indicated of PSVT. I'll set him up to see me in 2 years, continue medical therapy for now and he is aware when to seek medical attention and also Valsalva maneuver.  Adrian Prows, MD, [facility] 12/31/2019, 2:43 PM [facility]. [place] Office: [phone]

## 2020-01-03 ENCOUNTER — Telehealth: Payer: Self-pay

## 2020-01-03 DIAGNOSIS — R002 Palpitations: Secondary | ICD-10-CM

## 2020-01-03 DIAGNOSIS — I471 Supraventricular tachycardia: Secondary | ICD-10-CM

## 2020-01-03 NOTE — Telephone Encounter (Signed)
Pt called to inform us that he had an appt Tuesday but could not make it. Pt make he is having cp, palps, upset stomach, fatigue, and chills. No fever, cough, dizziness, nor headaches. Please advise.

## 2020-01-03 NOTE — Telephone Encounter (Signed)
Patient recently is having episodes of palpitations, suspect he probably had PSVT episode on Tuesday on 12/31/2019, he would like to get a monitor.  Please set up for extended telemetry for 10 days and set up an appointment to see me after the monitor.

## 2020-01-06 NOTE — Telephone Encounter (Signed)
I need orderes please, once you finish speaking with patient please transfer call front for scheduling.   Thank you

## 2020-01-07 ENCOUNTER — Telehealth: Payer: Self-pay | Admitting: Cardiology

## 2020-01-07 NOTE — Telephone Encounter (Signed)
Patient called the answering service in regards to when to go to the urgent care and/or ER.  I have informed him that if his symptoms increase in intensity, frequency, and/or duration he should seek medical attention sooner.  We also reviewed the symptoms of typical chest pain.  His questions and concerns were addressed to his satisfaction.  Rex Kras, [place], [facility]  Pager: [phone] Office: [phone]

## 2020-01-07 NOTE — Telephone Encounter (Signed)
I have placed orders

## 2020-01-07 NOTE — Telephone Encounter (Signed)
Sure. Eugene Aguilar 1 week

## 2020-01-07 NOTE — Telephone Encounter (Signed)
I can see him or if he needs to go to the ED or urgent care, he can

## 2020-01-07 NOTE — Telephone Encounter (Signed)
Patient at frequent episodes of palpitations will place remote outpatient cardiac telemetry for 1 week to exclude atrial fibrillation and also to monitor for SVT frequency and burden and I will see him back after that.    ICD-10-CM   1. Palpitations  R00.2 LONG TERM MONITOR-LIVE TELEMETRY (3-14 DAYS)  2. PSVT (paroxysmal supraventricular tachycardia) (HCC)  I47.1 LONG TERM MONITOR-LIVE TELEMETRY (3-14 DAYS)   Adrian Prows, MD, [facility] 01/07/2020, 9:20 PM [facility]. [place] Office: [phone]

## 2020-01-07 NOTE — Telephone Encounter (Signed)
Patient's insurance has not approved monitor yet. Maudry Mayhew is working on this. He is upset because he is still having chest pain. He is requesting medication or should he be going to the emergency room. Please advise.

## 2020-01-07 NOTE — Telephone Encounter (Signed)
Spoke with patient, let him know that [place] called his insurance company and he was approved for a 7day monitor. He would rather do the 7 days first, then if he has too, he will schedule an appt. to be seen. Would you prefer for him to do that first? Please advise.

## 2020-01-08 ENCOUNTER — Other Ambulatory Visit: Payer: Self-pay

## 2020-01-08 ENCOUNTER — Ambulatory Visit: Payer: 59

## 2020-01-08 DIAGNOSIS — I471 Supraventricular tachycardia, unspecified: Secondary | ICD-10-CM

## 2020-01-08 DIAGNOSIS — R002 Palpitations: Secondary | ICD-10-CM

## 2020-01-08 NOTE — Telephone Encounter (Signed)
Patient will be here at 2:30, please put him on the schedule. Thanks!

## 2020-01-16 ENCOUNTER — Telehealth: Payer: Self-pay

## 2020-01-16 ENCOUNTER — Other Ambulatory Visit: Payer: Self-pay

## 2020-01-16 DIAGNOSIS — R059 Cough, unspecified: Secondary | ICD-10-CM

## 2020-01-16 DIAGNOSIS — R05 Cough: Secondary | ICD-10-CM

## 2020-01-16 MED ORDER — PROMETHAZINE-PHENYLEPH-CODEINE 6.25-5-10 MG/5ML PO SYRP: 5.0000 mL | ORAL_SOLUTION | Freq: Three times a day (TID) | ORAL | 0 refills | Status: DC | PRN

## 2020-01-16 MED ORDER — PROMETHAZINE-CODEINE 6.25-10 MG/5ML PO SYRP: 5.0000 mL | ORAL_SOLUTION | Freq: Four times a day (QID) | ORAL | 0 refills | Status: DC | PRN

## 2020-01-17 NOTE — Progress Notes (Signed)
Primary Physician/Referring:  Velna Hatchet, MD  Patient ID: Eugene Aguilar, male    DOB: May 06, 1986, 34 y.o.   MRN: [id number]  Chief Complaint  Patient presents with  . Results  . Follow-up    CP   HPI:    Eugene Aguilar  is a 34 y.o. Caucasian male with history of very small VSD since childhood, symptoms of palpitations suggestive of PSVT, last sustained episode was in 2020 a year ago, presents here for annual visit.  He is aware of Valsalva maneuver. He is fairly active, exercises regularly, he does not smoke cigarettes or drink excessive alcohol and there is no history of drug use.  No history of sudden cardiac death in the family.  Presently not on any medications.  Patient made an appointment to see has has over the past 3 to 4 weeks he started having frequent episodes of palpitations and also marked fatigue and also chest pain.  On questioning, feels like "flip-flop" and does not feel like his SVT.  Chest pain is described as sharp pain in the left side of the chest in the parasternal region associated with pain when he takes a deep breath.  Since the episode occurred 3 weeks ago with chest pain and palpitation, this has left him markedly fatigued and tired.  He also complained of a dry cough but does admit to having history of GERD.    Past Medical History:  Diagnosis Date  . Heart murmur    VSD since childhood   Past Surgical History:  Procedure Laterality Date  . COLONOSCOPY  11/11/2019   Family History  Problem Relation Age of Onset  . Hyperlipidemia Father   . Hypertension Father   . Hypertension Maternal Grandmother   . Stroke Maternal Grandfather   . Heart attack Paternal Grandmother   . Heart attack Paternal Grandfather     Social History   Tobacco Use  . Smoking status: Never Smoker  . Smokeless tobacco: Never Used  Substance Use Topics  . Alcohol use: Not Currently    Comment: quit march 2020   Marital Status: Married  ROS  Review of Systems   Constitution: Negative for weight gain.  Cardiovascular: Negative for dyspnea on exertion, leg swelling and syncope.  Respiratory: Negative for shortness of breath.   Musculoskeletal: Negative for joint swelling.   Objective  Blood pressure 115/72, pulse 76, temperature 98 F (36.7 C), temperature source Temporal, resp. rate 15, height 5\' 10"  (1.778 m), weight 146 lb (66.2 kg), SpO2 100 %.  Vitals with BMI 01/20/2020 03/29/2019 07/14/2016  Height 5\' 10"  5\' 10"  5\' 9"   Weight 146 lbs 140 lbs 5 oz 143 lbs 10 oz  BMI 20.95 [id number]  Systolic [id number]  Diastolic 72 82 67  Pulse 76 88 76     Physical Exam  Constitutional: He appears well-developed and well-nourished. No distress.  Cardiovascular: Normal rate, regular rhythm and intact distal pulses. Exam reveals no gallop.  No murmur heard. No leg edema. No JVD.    Pulmonary/Chest: Effort normal and breath sounds normal. No accessory muscle usage. No respiratory distress.  Abdominal: Soft.   Laboratory examination:   No results for input(s): NA, K, CL, CO2, GLUCOSE, BUN, CREATININE, CALCIUM, GFRNONAA, GFRAA in the last 8760 hours. CrCl cannot be calculated (Patient's most recent lab result is older than the maximum 21 days allowed.).  CMP Latest Ref Rng & Units 03/01/2016  Glucose 65 - 99 mg/dL 68  BUN 7 -  25 mg/dL 13  Creatinine 0.60 - 1.35 mg/dL 1.17  Sodium 135 - 146 mmol/L 141  Potassium 3.5 - 5.3 mmol/L 4.1  Chloride 98 - 110 mmol/L 102  CO2 20 - 31 mmol/L 31  Calcium 8.6 - 10.3 mg/dL 9.5   CBC Latest Ref Rng & Units 03/01/2016  WBC 3.8 - 10.8 K/uL 4.2  Hemoglobin 13.2 - 17.1 g/dL 15.6  Hematocrit 38.5 - 50.0 % 47.2  Platelets 140 - 400 K/uL 196   Lipid Panel  No results found for: CHOL, TRIG, HDL, CHOLHDL, VLDL, LDLCALC, LDLDIRECT HEMOGLOBIN A1C No results found for: HGBA1C, MPG TSH No results for input(s): TSH in the last 8760 hours.  External labs:   Lipid Panel completed 03/14/2017 Cholesterol, total 180.000 m  03/14/2017 Triglycerides 87.000 03/14/2017 HDL 41 MG/DL 03/14/2017 LDL 122.000 m 03/14/2017  Glucose Random 89.000 mg 03/14/2017  BUN 15.000 mg 03/14/2017 Creatinine, Serum 1.200 mg/ 03/14/2017  TSH 1.680 micr 03/14/2017  Medications and allergies  No Known Allergies   Current Outpatient Medications  Medication Instructions  . amitriptyline (ELAVIL) 25 mg, Oral, Daily at bedtime   Radiology:   No results found.  Cardiac Studies:   Echocardiogram 03/08/2019 :  1. The left ventricle has normal systolic function with an ejection fraction of 60-65%. The cavity size was normal. Left ventricular diastolic parameters were normal. No evidence of left ventricular regional wall motion abnormalities. 2. Small membranous ventricular septal defect with left to right shunting. 3. The right ventricle has normal systolic function. The cavity was normal. There is no increase in right ventricular wall thickness. 4. The tricuspid valve is grossly normal. 5. The aortic valve is tricuspid. No stenosis of the aortic valve. 6. Cannot exclude small atrial septal defect. 7. The interatrial septum is aneurysmal. 8. The mitral valve is grossly normal. 9. When compared to the prior study: 03/09/2017: LVEF 55-60%, small perimembranous VSD.   Event Monitor for 10 days Start date 01/08/2020-01/14/2020:  Predominant rhythm is normal sinus rhythm.  Minimum heart rate 50, average heart rate 85, maximum heart rate 178 bpm. Ventricular ectopy 5386, burden <1%.  There were 4 ventricular couplets. Supraventricular ectopy 2672.  SVT burden <0.5%.  There are rare rare atrial tachycardia longest being 6 beats. Patient activated events revealed occasional PVCs.  There is no SVT, no atrial fibrillation.  EKG  EKG 01/20/2020: Normal sinus rhythm at the rate of 70 bpm, normal axis, early repolarization.  No significant change from 03/29/2019.  Assessment     ICD-10-CM   1. Palpitations  R00.2 EKG 12-Lead  2. PSVT (paroxysmal  supraventricular tachycardia) (HCC)  I47.1 PCV ECHOCARDIOGRAM COMPLETE  3. Ventricular septal defect (VSD), membranous  Q21.0 PCV ECHOCARDIOGRAM COMPLETE  4. Malaise and fatigue  R53.81    R53.83      No orders of the defined types were placed in this encounter.   Medications Discontinued During This Encounter  Medication Reason  . promethazine-codeine (PHENERGAN WITH CODEINE) 6.25-10 MG/5ML syrup Prescription never filled  . sucralfate (CARAFATE) 1 g tablet Patient has not taken in last 30 days    Recommendations:   LANDIN ENGQUIST  is a 34 y.o. Caucasian male with history of very small VSD since childhood, symptoms of palpitations suggestive of PSVT, last sustained episode was in 2020 a year ago, presents here for annual visit.  He is aware of Valsalva maneuver. He is fairly active, exercises regularly, he does not smoke cigarettes or drink excessive alcohol and there is no history of drug  use.  No history of sudden cardiac death in the family.  Presently not on any medications.  Patient symptoms of palpitations clearly suggest PACs and PVCs are not PSVT.  His chest pain appears to be musculoskeletal and his cough is related to his GERD, advised him to try over-the-counter omeprazole.  I reassured him, no significant change in loud ventricular septal defect murmur.  However in view of his symptoms of dyspnea, new palpitations and chest pain, I would like to repeat an echocardiogram.  With regard to malaise and fatigue, it may be nonspecific at this time, watchful waiting is indicated.  Do not suspect myocarditis or endocarditis, no fever, no splenomegaly, no clinical evidence of heart failure.  Adrian Prows, MD, [facility] 01/24/2020, 6:26 AM [facility] [place] Pager: [phone] Office: [phone]

## 2020-01-20 ENCOUNTER — Other Ambulatory Visit: Payer: Self-pay

## 2020-01-20 ENCOUNTER — Ambulatory Visit: Payer: 59 | Admitting: Cardiology

## 2020-01-20 ENCOUNTER — Encounter: Payer: Self-pay | Admitting: Cardiology

## 2020-01-20 VITALS — BP 115/72 | HR 76 | Temp 98.0°F | Resp 15 | Ht 70.0 in | Wt 146.0 lb

## 2020-01-20 DIAGNOSIS — R5383 Other fatigue: Secondary | ICD-10-CM

## 2020-01-20 DIAGNOSIS — I471 Supraventricular tachycardia: Secondary | ICD-10-CM

## 2020-01-20 DIAGNOSIS — R5381 Other malaise: Secondary | ICD-10-CM

## 2020-01-20 DIAGNOSIS — Q210 Ventricular septal defect: Secondary | ICD-10-CM

## 2020-01-20 DIAGNOSIS — R002 Palpitations: Secondary | ICD-10-CM

## 2020-02-03 ENCOUNTER — Other Ambulatory Visit: Payer: Self-pay

## 2020-02-03 ENCOUNTER — Ambulatory Visit: Payer: 59

## 2020-02-03 DIAGNOSIS — Q210 Ventricular septal defect: Secondary | ICD-10-CM

## 2020-02-03 DIAGNOSIS — I471 Supraventricular tachycardia: Secondary | ICD-10-CM

## 2020-02-13 NOTE — Telephone Encounter (Signed)
error 

## 2020-08-28 ENCOUNTER — Other Ambulatory Visit: Payer: Self-pay | Admitting: Internal Medicine

## 2020-08-28 ENCOUNTER — Ambulatory Visit
Admission: RE | Admit: 2020-08-28 | Discharge: 2020-08-28 | Disposition: A | Discharge status: Home / Self Care | Payer: 59 | Source: Ambulatory Visit | Attending: Internal Medicine | Admitting: Internal Medicine

## 2020-08-28 ENCOUNTER — Other Ambulatory Visit: Payer: Self-pay

## 2020-08-28 DIAGNOSIS — R590 Localized enlarged lymph nodes: Secondary | ICD-10-CM

## 2020-08-28 MED ORDER — IOPAMIDOL (ISOVUE-300) INJECTION 61%
100.0000 mL | Freq: Once | INTRAVENOUS | Status: AC | PRN
  Administered 2020-08-28: 100 mL via INTRAVENOUS

## 2021-01-20 ENCOUNTER — Encounter: Payer: Self-pay | Admitting: Student

## 2021-01-20 ENCOUNTER — Ambulatory Visit: Payer: 59 | Admitting: Student

## 2021-01-20 ENCOUNTER — Other Ambulatory Visit: Payer: Self-pay

## 2021-01-20 VITALS — BP 115/84 | HR 78 | Temp 98.2°F | Ht 70.0 in | Wt 156.0 lb

## 2021-01-20 DIAGNOSIS — Q210 Ventricular septal defect: Secondary | ICD-10-CM

## 2021-01-20 DIAGNOSIS — I471 Supraventricular tachycardia: Secondary | ICD-10-CM

## 2021-01-20 DIAGNOSIS — R002 Palpitations: Secondary | ICD-10-CM

## 2021-01-20 NOTE — Progress Notes (Addendum)
Primary Physician/Referring:  Velna Hatchet, MD  Patient ID: Eugene Aguilar, male    DOB: June 09, 1986, 35 y.o.   MRN: [id number]  Chief Complaint  Patient presents with  . PSVT  . Follow-up  . Ventricular septal defect   HPI:    Eugene Aguilar  is a 35 y.o. Caucasian male with history of very small membranous VSD since childhood, symptoms of palpitations suggestive of PSVT, last sustained episode was in 2020, presents here for annual visit.  Patient remains fairly active playing soccer once per week and walking or jogging on the days he does not play soccer.  He continues to avoid tobacco use as well as excessive alcohol intake.  Denies history of drug use or sudden cardiac death in his family.  He continues to have occasional episodes of GERD for which he takes Zantac as needed.  Denies chest pain, palpitations, syncope, near syncope, dizziness.  Past Medical History:  Diagnosis Date  . Heart murmur    VSD since childhood   Past Surgical History:  Procedure Laterality Date  . COLONOSCOPY  11/11/2019   Family History  Problem Relation Age of Onset  . Hyperlipidemia Father   . Hypertension Father   . Hypertension Maternal Grandmother   . Stroke Maternal Grandfather   . Heart attack Paternal Grandmother   . Heart attack Paternal Grandfather     Social History   Tobacco Use  . Smoking status: Never Smoker  . Smokeless tobacco: Never Used  Substance Use Topics  . Alcohol use: Not Currently    Comment: quit march 2020   Marital Status: Married  ROS  Review of Systems  Constitutional: Negative for malaise/fatigue and weight gain.  Cardiovascular: Negative for chest pain, claudication, dyspnea on exertion, leg swelling, near-syncope, orthopnea, palpitations, paroxysmal nocturnal dyspnea and syncope.  Respiratory: Negative for shortness of breath.   Hematologic/Lymphatic: Does not bruise/bleed easily.  Musculoskeletal: Negative for joint swelling.   Gastrointestinal: Negative for melena.  Neurological: Negative for dizziness and weakness.   Objective  Blood pressure 115/84, pulse 78, temperature 98.2 F (36.8 C), height _0  (1.778 m), weight 156 lb (70.8 kg), SpO2 100 %.  Vitals with BMI 01/20/2021 01/20/2020 03/29/2019  Height _1  _2  _3   Weight 156 lbs 146 lbs 140 lbs 5 oz  BMI 22.38 38.18 29.93  Systolic 716 967 893  Diastolic 84 72 82  Pulse 78 76 88     Physical Exam Constitutional:      Eugene: He is not in acute distress.    Appearance: He is well-developed.  Cardiovascular:     Rate and Rhythm: Normal rate and regular rhythm.     Pulses: Intact distal pulses.     Heart sounds: Murmur heard.   Harsh holosystolic murmur is present with a grade of 3/6 at the lower left sternal border radiating to the lower right sternal border and apex. No gallop.      Comments: No leg edema. No JVD.   Pulmonary:     Effort: Pulmonary effort is normal. No accessory muscle usage or respiratory distress.     Breath sounds: Normal breath sounds.  Abdominal:     Palpations: Abdomen is soft.    Laboratory examination:   No results for input(s): NA, K, CL, CO2, GLUCOSE, BUN, CREATININE, CALCIUM, GFRNONAA, GFRAA in the last 8760 hours. CrCl cannot be calculated (Patient's most recent lab result is older than the maximum 21 days allowed.).  CMP Latest Ref Rng &  Units 03/01/2016  Glucose 65 - 99 mg/dL 68  BUN 7 - 25 mg/dL 13  Creatinine 0.60 - 1.35 mg/dL 1.17  Sodium 135 - 146 mmol/L 141  Potassium 3.5 - 5.3 mmol/L 4.1  Chloride 98 - 110 mmol/L 102  CO2 20 - 31 mmol/L 31  Calcium 8.6 - 10.3 mg/dL 9.5   CBC Latest Ref Rng & Units 03/01/2016  WBC 3.8 - 10.8 K/uL 4.2  Hemoglobin 13.2 - 17.1 g/dL 15.6  Hematocrit 38.5 - 50.0 % 47.2  Platelets 140 - 400 K/uL 196   Lipid Panel  No results found for: CHOL, TRIG, HDL, CHOLHDL, VLDL, LDLCALC, LDLDIRECT HEMOGLOBIN A1C No results found for: HGBA1C, MPG TSH No results for  input(s): TSH in the last 8760 hours.  External labs:   Lipid Panel completed 03/14/2017 Cholesterol, total 180.000 m 03/14/2017 Triglycerides 87.000 03/14/2017 HDL 41 MG/DL 03/14/2017 LDL 122.000 m 03/14/2017  Glucose Random 89.000 mg 03/14/2017  BUN 15.000 mg 03/14/2017 Creatinine, Serum 1.200 mg/ 03/14/2017  TSH 1.680 micr 03/14/2017  Medications and allergies  No Known Allergies   Current Outpatient Medications on File Prior to Visit  Medication Sig Dispense Refill  . amitriptyline (ELAVIL) 25 MG tablet Take 25 mg by mouth at bedtime.    . raNITIdine HCl (ZANTAC 75 PO) Take 1 capsule by mouth daily.     No current facility-administered medications on file prior to visit.    Radiology:   No results found.  Cardiac Studies:   Event Monitor for 10 days Start date 01/08/2020-01/14/2020:  Predominant rhythm is normal sinus rhythm.  Minimum heart rate 50, average heart rate 85, maximum heart rate 178 bpm. Ventricular ectopy 5386, burden <1%.  There were 4 ventricular couplets. Supraventricular ectopy 2672.  SVT burden <0.5%.  There are rare rare atrial tachycardia longest being 6 beats. Patient activated events revealed occasional PVCs.  There is no SVT, no atrial fibrillation.  Echocardiogram 02/03/2020: Left ventricle cavity is normal in size and wall thickness. Normal LV systolic function with visual EF 50-55%. Normal global wall motion. Normal diastolic filling pattern. Small membranous ventricular septal defect with left-to-right shunt.  Left atrial cavity is normal in size. Aneurysmal interatrial septum. Cannot exclude atrial septal defect/PFO.  Unable to accurately evaluate severity of tricuspid regurgitation due to admixture of TR jet and VSD jet. Consider TEE, if clinically indicated. Estimated RA pressure 8 mmHg. No significant change compared to previous study on 03/08/2019.   EKG   EKG 01/20/2021: Normal sinus rhythm at a rate of 72 bpm.  Normal axis.  Early repolarization.   Compared to EKG 01/20/2020, no significant change.  Assessment     ICD-10-CM   1. Palpitations  R00.2   2. PSVT (paroxysmal supraventricular tachycardia) (HCC)  I47.1 EKG 12-Lead  3. Ventricular septal defect (VSD), membranous  Q21.0 EKG 12-Lead     No orders of the defined types were placed in this encounter.   There are no discontinued medications.  Recommendations:   CASHEL BELLINA  is a 35 y.o. Caucasian male with history of very small membranous VSD since childhood, symptoms of palpitations suggestive of PSVT, last sustained episode was in 2020, presents here for annual visit.    Patient remains fairly active and has had no recurrence of palpitations in the last 1 year.  He does continue to have occasional episodes of GERD for which he takes Zantac and has been evaluated by GI.  Physical exam remained stable, no change in mild VSD murmur.  He  is also had no recurrence of dyspnea or chest pain symptoms.  Reviewed and discussed with patient results of echocardiogram, details above.  Echocardiogram has remained stable compared to previous.  Again discussed with patient regarding Valsalva maneuver, he verbalized understanding.  Overall patient remains stable from a cardiovascular standpoint and there are no clinical signs of heart failure.  Is asymptomatic at this time.  Advised him to continue with healthy diet and lifestyle.  Follow-up in 1 year, sooner if needed, for palpitations and VSD.   Alethia Berthold, PA-C 01/20/2021, 10:29 AM Office: [phone]  Addendum: External labs 08/28/2020: Glucose 94, BUN 13, creatinine 1.0, GFR 85.5, sodium 140, potassium 3.8, AST 20, ALT 24, alk phos 92 Hemoglobin 15.4, hematocrit 44.5, MCV 85.4, platelet 206

## 2022-01-19 NOTE — Progress Notes (Signed)
? ?Primary Physician/Referring:  Velna Hatchet, MD ? ?Patient ID: Eugene Aguilar, male    DOB: [birth year], 36 y.o.   MRN: [id number] ? ?Chief Complaint  ?Patient presents with  ? Palpitations  ? Ventricular Septal Defect  ? Follow-up  ?  1 year  ? ?HPI:   ? ?Eugene Aguilar  is a 36 y.o. Caucasian male with history of very small membranous VSD since childhood, symptoms of palpitations suggestive of PSVT, last sustained episode was in 2020.  ? ?Patient presents for annual follow up. Echocardiogram done at that time revealed normal LVEF with very small known VSD unchanged compared to prior echo in 2020.  Patient was then lost to follow-up and now presents for visit regarding PSVT and VSD.  Patient continues to feel well overall.  He walks daily without issue and plays soccer once weekly.  Denies chest pain, palpitations, syncope, near syncope, dizziness.  Denies symptoms suggestive of TIA or CVA. ? ?Recently reviewed external labs, lipids are uncontrolled.  He has discussed this with PCP and is working on making diet and lifestyle modifications. ? ?Past Medical History:  ?Diagnosis Date  ? Heart murmur   ? VSD since childhood  ? ?Past Surgical History:  ?Procedure Laterality Date  ? COLONOSCOPY  11/11/2019  ? ?Family History  ?Problem Relation Age of Onset  ? Hyperlipidemia Father   ? Hypertension Father   ? Hypertension Maternal Grandmother   ? Stroke Maternal Grandfather   ? Heart attack Paternal Grandmother   ? Heart attack Paternal Grandfather   ?  ?Social History  ? ?Tobacco Use  ? Smoking status: Never  ? Smokeless tobacco: Never  ?Substance Use Topics  ? Alcohol use: Not Currently  ?  Comment: quit march 2020  ? ?Marital Status: Married  ?ROS  ?Review of Systems  ?Constitutional: Negative for malaise/fatigue and weight gain.  ?Cardiovascular:  Negative for chest pain, claudication, dyspnea on exertion, leg swelling, near-syncope, orthopnea, palpitations, paroxysmal nocturnal dyspnea and syncope.   ?Neurological:  Negative for dizziness.  ?Objective  ?Blood pressure 117/79, pulse 87, temperature 98.3 ?F (36.8 ?C), temperature source Temporal, resp. rate 18, height '5\' 10"'  (1.778 m), weight 152 lb 12.8 oz (69.3 kg), SpO2 97 %.  ? ?  01/20/2022  ?  8:37 AM 01/20/2021  ?  9:59 AM 01/20/2020  ? 10:00 AM  ?Vitals with BMI  ?Height '5\' 10"'  '5\' 10"'  '5\' 10"'   ?Weight 152 lbs 13 oz 156 lbs 146 lbs  ?BMI 21.92 22.38 20.95  ?Systolic 032 122 482  ?Diastolic 79 84 72  ?Pulse 87 78 76  ?  ? Physical Exam ?Constitutional:   ?   General: He is not in acute distress. ?   Appearance: He is well-developed.  ?Cardiovascular:  ?   Rate and Rhythm: Normal rate and regular rhythm.  ?   Pulses: Intact distal pulses.  ?   Heart sounds: Murmur heard.  ?Harsh holosystolic murmur is present with a grade of 3/6 at the lower left sternal border radiating to the lower right sternal border and apex.  ?  No gallop.  ?   Comments:   ?Pulmonary:  ?   Effort: Pulmonary effort is normal. No accessory muscle usage or respiratory distress.  ?   Breath sounds: Normal breath sounds.  ?Musculoskeletal:  ?   Right lower leg: No edema.  ?   Left lower leg: No edema.  ? ?Laboratory examination:  ? ?No results for input(s): NA, K, CL, CO2, GLUCOSE,  BUN, CREATININE, CALCIUM, GFRNONAA, GFRAA in the last 8760 hours. ?CrCl cannot be calculated (Patient's most recent lab result is older than the maximum 21 days allowed.).  ? ?  Latest Ref Rng & Units 03/01/2016  ?  2:36 PM  ?CMP  ?Glucose 65 - 99 mg/dL 68    ?BUN 7 - 25 mg/dL 13    ?Creatinine 0.60 - 1.35 mg/dL 1.17    ?Sodium 135 - 146 mmol/L 141    ?Potassium 3.5 - 5.3 mmol/L 4.1    ?Chloride 98 - 110 mmol/L 102    ?CO2 20 - 31 mmol/L 31    ?Calcium 8.6 - 10.3 mg/dL 9.5    ? ? ?  Latest Ref Rng & Units 03/01/2016  ?  2:36 PM  ?CBC  ?WBC 3.8 - 10.8 K/uL 4.2    ?Hemoglobin 13.2 - 17.1 g/dL 15.6    ?Hematocrit 38.5 - 50.0 % 47.2    ?Platelets 140 - 400 K/uL 196    ? ?Lipid Panel  ?No results found for: CHOL, TRIG, HDL,  CHOLHDL, VLDL, LDLCALC, LDLDIRECT ?HEMOGLOBIN A1C ?No results found for: HGBA1C, MPG ?TSH ?No results for input(s): TSH in the last 8760 hours. ? ?External labs:  ?09/18/2021: ?BUN 12, creatinine 1.1, GFR >60, potassium 4.9, sodium 145 ?Hgb 15.8, HCT 46.0, MCV 83.2, platelet 258 ?Total cholesterol 208, HDL 40, LDL 142, triglycerides 120 ?TSH 0.90 ? ?08/28/2020: ?Glucose 94, BUN 13, creatinine 1.0, GFR 85.5, sodium 140, potassium 3.8, AST 20, ALT 24, alk phos 92 ?Hemoglobin 15.4, hematocrit 44.5, MCV 85.4, platelet 206 ? ?Allergies  ?No Known Allergies  ? ?Medications Prior to Visit:  ? ?Outpatient Medications Prior to Visit  ?Medication Sig Dispense Refill  ? amitriptyline (ELAVIL) 25 MG tablet Take 25 mg by mouth at bedtime.    ? famotidine (PEPCID) 10 MG tablet Take 10 mg by mouth 2 (two) times daily.    ? metoprolol tartrate (LOPRESSOR) 25 MG tablet Take 1 tablet by mouth as needed. For palpitations    ? raNITIdine HCl (ZANTAC 75 PO) Take 1 capsule by mouth daily.    ? ?No facility-administered medications prior to visit.  ? ?Final Medications at End of Visit   ? ?Current Meds  ?Medication Sig  ? amitriptyline (ELAVIL) 25 MG tablet Take 25 mg by mouth at bedtime.  ? famotidine (PEPCID) 10 MG tablet Take 10 mg by mouth 2 (two) times daily.  ? metoprolol tartrate (LOPRESSOR) 25 MG tablet Take 1 tablet by mouth as needed. For palpitations  ?  ?Radiology:  ? ?No results found. ? ?Cardiac Studies:  ? ?Event Monitor for 10 days Start date 01/08/2020-01/14/2020:  ?Predominant rhythm is normal sinus rhythm.  Minimum heart rate 50, average heart rate 85, maximum heart rate 178 bpm. ?Ventricular ectopy 5386, burden <1%.  There were 4 ventricular couplets. ?Supraventricular ectopy 2672.  SVT burden <0.5%.  There are rare rare atrial tachycardia longest being 6 beats. ?Patient activated events revealed occasional PVCs.  There is no SVT, no atrial fibrillation. ? ?Echocardiogram 02/03/2020: ?Left ventricle cavity is normal in  size and wall thickness. Normal LV systolic function with visual EF 50-55%. Normal global wall motion. Normal diastolic filling pattern. Small membranous ventricular septal defect with left-to-right shunt.  ?Left atrial cavity is normal in size. ?Aneurysmal interatrial septum. Cannot exclude atrial septal defect/PFO.  ?Unable to accurately evaluate severity of tricuspid regurgitation due to admixture of TR jet and VSD jet. Consider TEE, if clinically indicated. ?Estimated RA pressure 8 mmHg. ?  No significant change compared to previous study on 03/08/2019.  ? ?EKG  ?01/20/2022: Normal sinus rhythm at a rate of 85 bpm.  Normal axis.  Early repolarization.  Compared EKG 01/20/2021, no significant change. ? ?Assessment  ? ?  ICD-10-CM   ?1. PSVT (paroxysmal supraventricular tachycardia) (HCC)  I47.1 EKG 12-Lead  ?  ?2. Ventricular septal defect (VSD), membranous  Q21.0 PCV ECHOCARDIOGRAM COMPLETE  ?  ?  ? ?No orders of the defined types were placed in this encounter. ?  ?Medications Discontinued During This Encounter  ?Medication Reason  ? raNITIdine HCl (ZANTAC 75 PO)   ?  ?Recommendations:  ? ?HAYK DIVIS  is a 36 y.o. Caucasian male with history of very small membranous VSD since childhood, symptoms of palpitations suggestive of PSVT, last sustained episode was in 2020.  ? ?Patient was last seen in our office 01/24/2020 by Dr. Einar Gip at which time he was advised to follow-up in 1 year.  Echocardiogram done at that time revealed normal LVEF with very small known VSD unchanged compared to prior echo in 2020.  Patient was then lost to follow-up and now presents for visit regarding PSVT and VSD.  Patient continues to do well overall without recurrence of palpitations.  He remains active.  Again discussed regarding Valsalva maneuver and patient verbalized understanding.  There is no clinical evidence of heart failure and he is asymptomatic at this time.  Reiterated the importance of diet modifications given uncontrolled  lipids.  No indication for statin therapy at this time, will defer further management to PCP. ? ?We will obtain repeat echocardiogram in 1 year prior to next office visit.  Follow-up in 1 year, sooner if neede

## 2022-01-20 ENCOUNTER — Ambulatory Visit: Payer: 59 | Admitting: Student

## 2022-01-20 ENCOUNTER — Encounter: Payer: Self-pay | Admitting: Student

## 2022-01-20 VITALS — BP 117/79 | HR 87 | Temp 98.3°F | Resp 18 | Ht 70.0 in | Wt 152.8 lb

## 2022-01-20 DIAGNOSIS — I471 Supraventricular tachycardia: Secondary | ICD-10-CM

## 2022-01-20 DIAGNOSIS — Q210 Ventricular septal defect: Secondary | ICD-10-CM

## 2022-07-13 IMAGING — CT CT ABD-PELV W/ CM
1 of 2 series · 14 of 32 positions shown, 19 images · IV contrast (APPLIED)
Comparison: 11/25/2019

CLINICAL DATA: Inguinal adenopathy. Right-sided groin swelling for
10 days.

EXAM:
CT ABDOMEN AND PELVIS WITH CONTRAST
TECHNIQUE: Multidetector CT imaging of the abdomen and pelvis was performed
using the standard protocol following bolus administration of
intravenous contrast.
CONTRAST:  100mL KCRBHC-6BB IOPAMIDOL (KCRBHC-6BB) INJECTION 61%

[Series 2: abd/pelvis w/cm · axial · 0.69mm/px · z∈[-532,-32]mm · 14 of 112 slices shown, 19 images]
[im 6/112  soft-tissue]
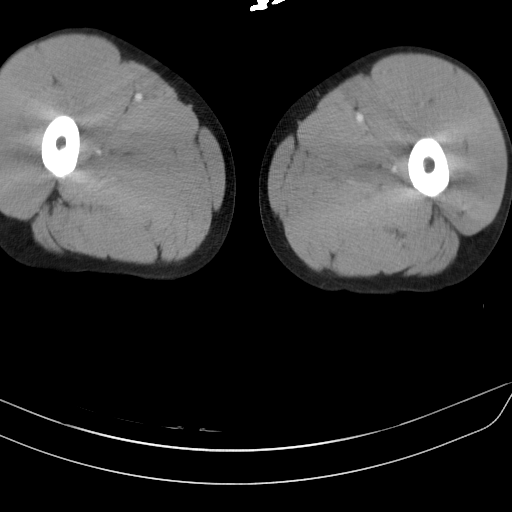
[im 6/112  bone]
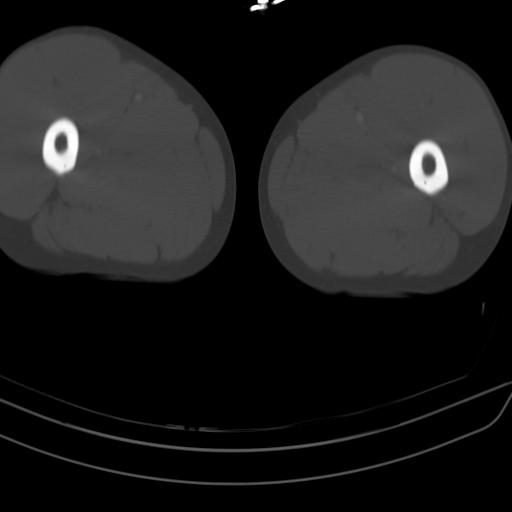
[im 17/112  soft-tissue]
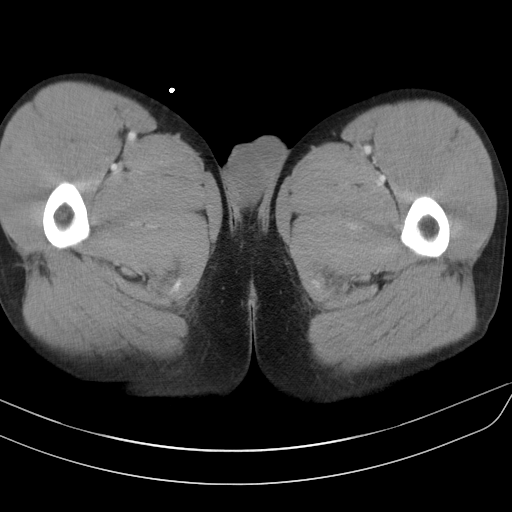
[im 23/112  soft-tissue]
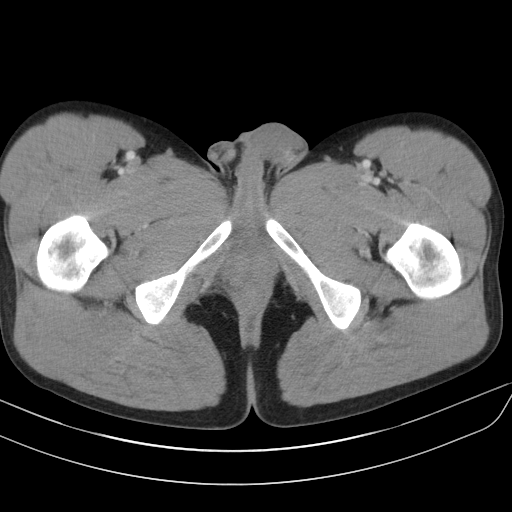
[im 34/112  soft-tissue]
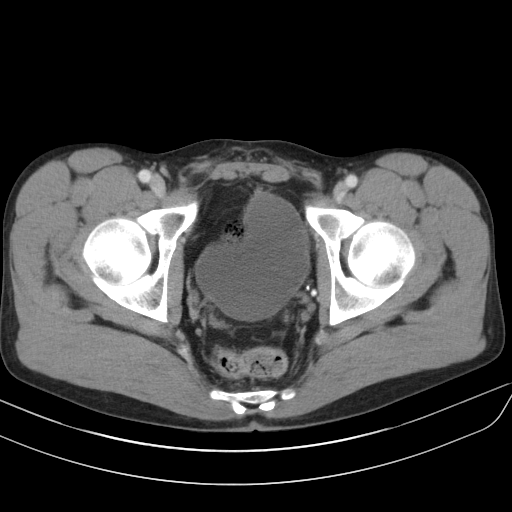
[im 39/112  soft-tissue]
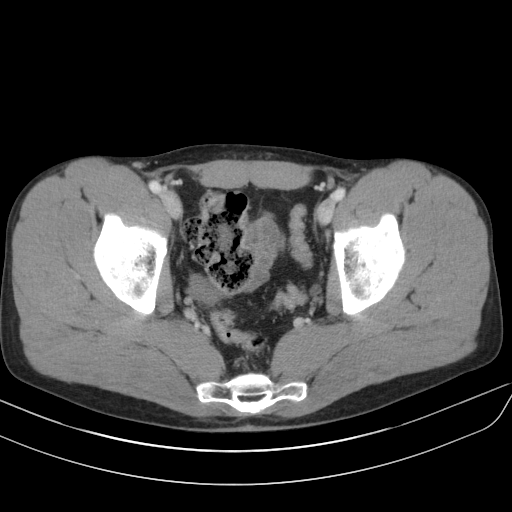
[im 50/112  soft-tissue]
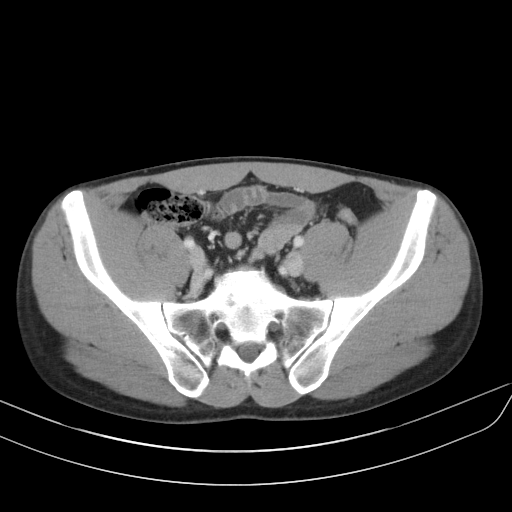
[im 56/112  soft-tissue]
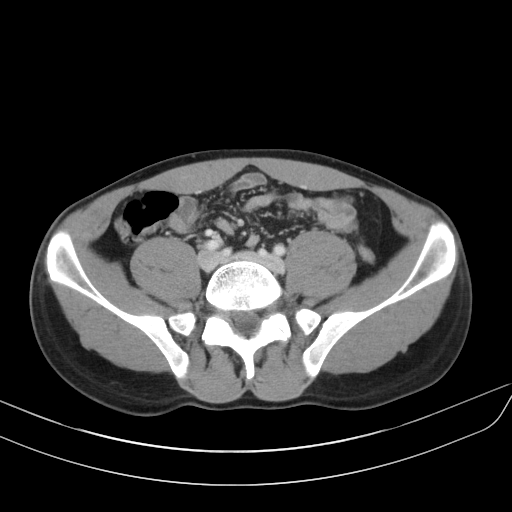
[im 62/112  soft-tissue]
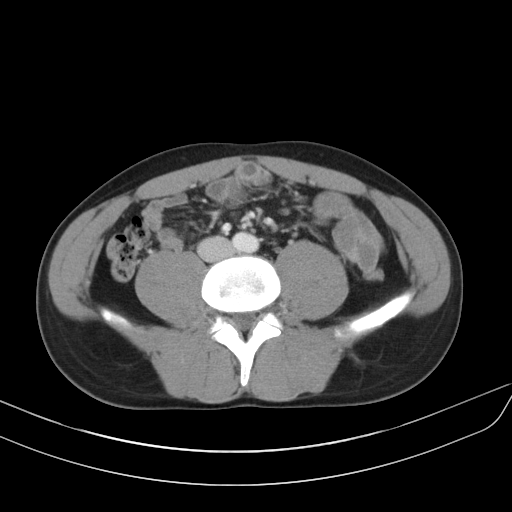
[im 73/112  soft-tissue]
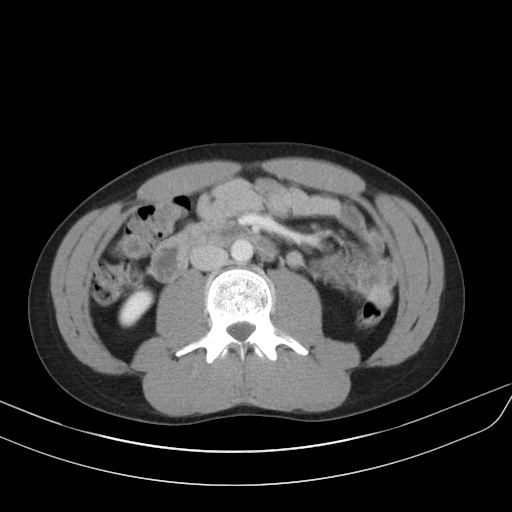
[im 73/112  bone]
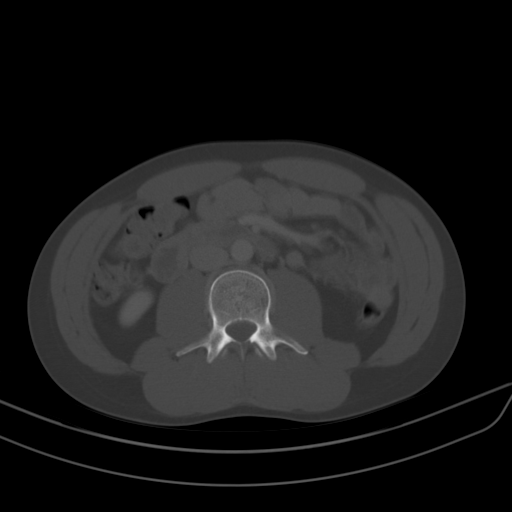
[im 78/112  soft-tissue]
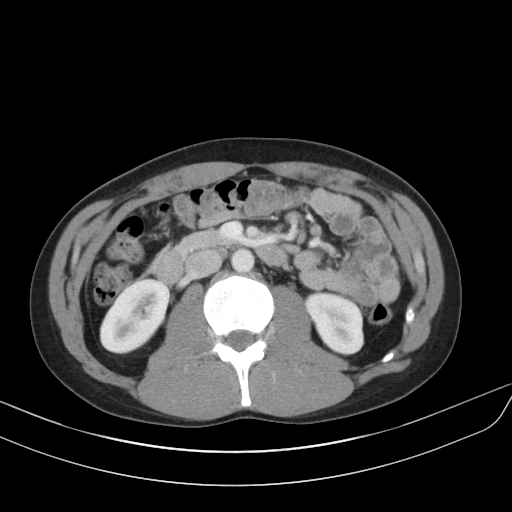
[im 89/112  soft-tissue]
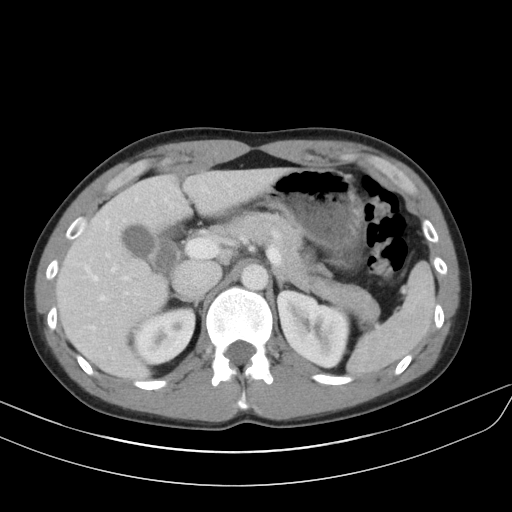
[im 89/112  lung]
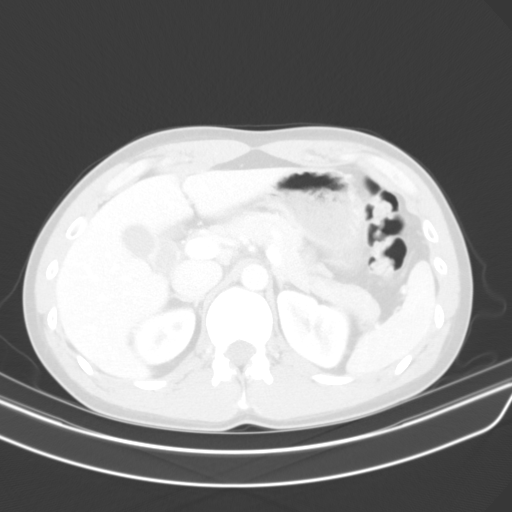
[im 95/112  soft-tissue]
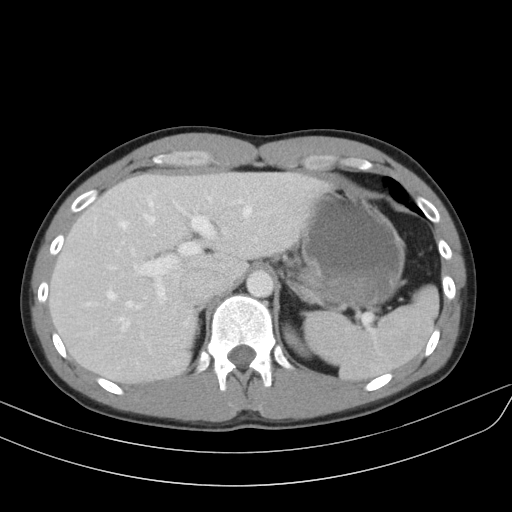
[im 95/112  lung]
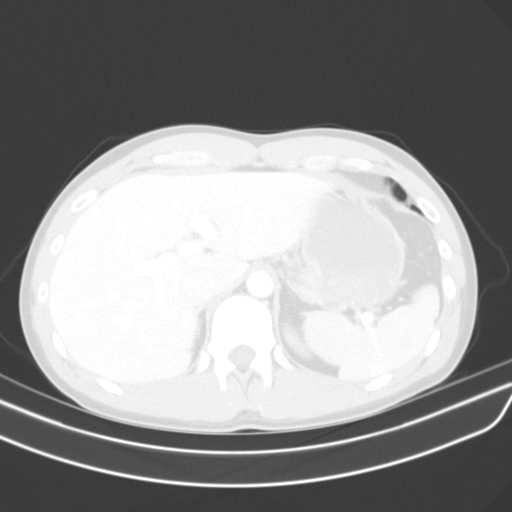
[im 100/112  lung]
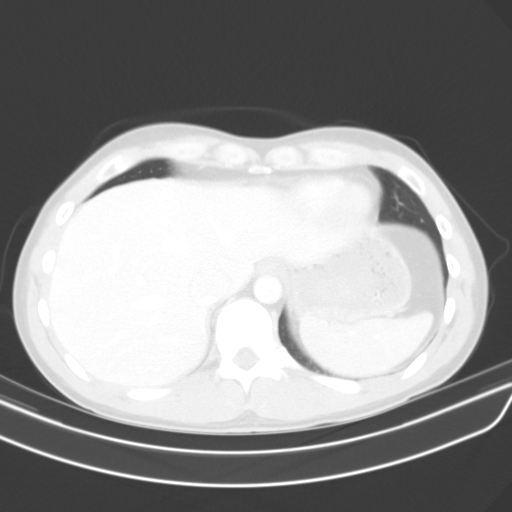
[im 106/112  soft-tissue]
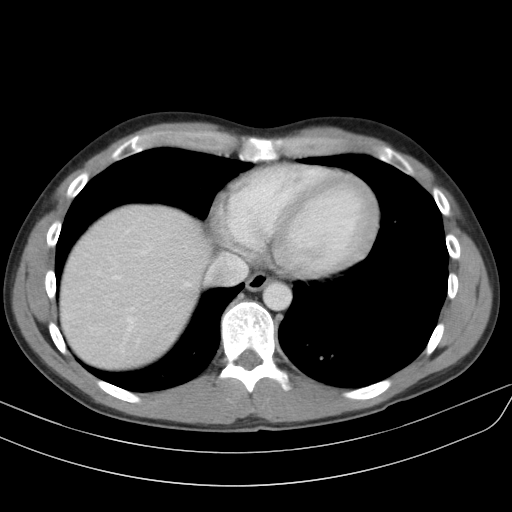
[im 106/112  lung]
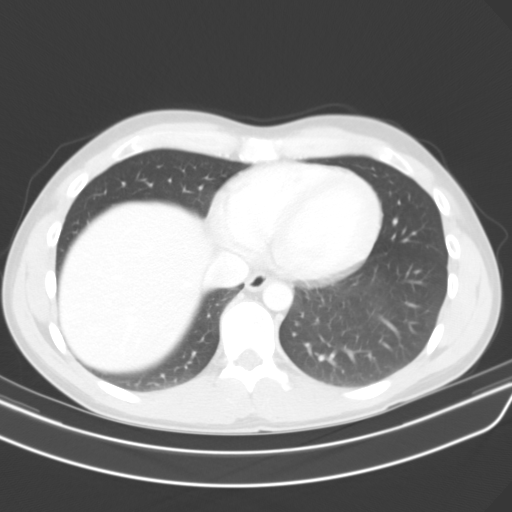

[14 of 32 positions shown; findings below may reference images not displayed]

FINDINGS: Lower chest: Minimal atelectasis in the lung bases. No pleural
effusion.

Hepatobiliary: No focal liver abnormality is seen. No gallstones,
gallbladder wall thickening, or biliary dilatation.

Pancreas: Unremarkable.

Spleen: Unremarkable.

Adrenals/Urinary Tract: Unremarkable adrenal glands. No evidence of
renal mass, calculi, or hydronephrosis. Unremarkable bladder.

Stomach/Bowel: The stomach is unremarkable. There is no evidence of
bowel obstruction or inflammation. The appendix is not clearly
identified.

Vascular/Lymphatic: No significant vascular findings are present. No
enlarged lymph nodes are identified. An 8 mm short axis lymph node
with fatty hilum in the right inguinal region is unchanged.

Reproductive: Unremarkable prostate.

Other: No ascites or pneumoperitoneum. No abdominal wall hernia.
Unchanged 1.6 cm intermediate to low attenuation nodule abutting the
anterior sacrum at the S1 level to the left of midline (series 2,
image 65.

Musculoskeletal: No acute osseous abnormality or suspicious osseous
lesion.
IMPRESSION: 1. No acute abnormality identified in the abdomen or pelvis.
[DATE] cm presacral nodule, nonspecific but unchanged in size and
benign in appearance. Considerations include a small nerve sheath
tumor, lymphangioma, or developmental cyst.

## 2023-01-18 ENCOUNTER — Ambulatory Visit: Payer: 59

## 2023-01-18 DIAGNOSIS — Q210 Ventricular septal defect: Secondary | ICD-10-CM

## 2023-01-20 ENCOUNTER — Other Ambulatory Visit: Payer: 59

## 2023-01-25 ENCOUNTER — Ambulatory Visit: Payer: 59 | Admitting: Student

## 2023-01-27 ENCOUNTER — Encounter: Payer: Self-pay | Admitting: Cardiology

## 2023-01-27 ENCOUNTER — Ambulatory Visit: Payer: 59 | Admitting: Cardiology

## 2023-01-27 VITALS — BP 114/85 | HR 84 | Resp 16 | Ht 70.0 in | Wt 155.8 lb

## 2023-01-27 DIAGNOSIS — Q210 Ventricular septal defect: Secondary | ICD-10-CM

## 2023-01-27 DIAGNOSIS — I471 Supraventricular tachycardia, unspecified: Secondary | ICD-10-CM

## 2023-01-27 NOTE — Progress Notes (Signed)
Primary Physician/Referring:  Alysia Penna, MD  Patient ID: Eugene Aguilar, male    DOB: [birth year], 37 y.o.   MRN: [id number]  Chief Complaint  Patient presents with   PSVT   Ventricular Septal Defect   HPI:    Eugene Aguilar  is a 37 y.o. Caucasian male with history of very small membranous VSD since childhood, Hx of PSVT, last sustained episode was in 2020. Marland Kitchen   Patient presents today for 1 year follow-up visit given his known history of nonobstructive membranous VSD and history of PSVT.  Over the last 1 year he is doing well from a cardiovascular standpoint.  He denies palpitations, near-syncope or syncopal events.  He walks approximately 2 hours/week and recently has invested in an iWatch and notes his heart rate today within normal limits with rare episodes of tachycardia which have resolved after burping/belching.  Most recent echocardiogram results reviewed with him and noted below for further reference.  Past Medical History:  Diagnosis Date   Heart murmur    VSD since childhood   Past Surgical History:  Procedure Laterality Date   COLONOSCOPY  11/11/2019   Family History  Problem Relation Age of Onset   Hyperlipidemia Father    Hypertension Father    Hypertension Maternal Grandmother    Stroke Maternal Grandfather    Heart attack Paternal Grandmother    Heart attack Paternal Grandfather     Social History   Tobacco Use   Smoking status: Never   Smokeless tobacco: Never  Substance Use Topics   Alcohol use: Not Currently    Comment: quit march 2020   Marital Status: Married  ROS  Review of Systems  Cardiovascular:  Negative for chest pain, claudication, dyspnea on exertion, irregular heartbeat, leg swelling, near-syncope, orthopnea, palpitations, paroxysmal nocturnal dyspnea and syncope.  Respiratory:  Negative for shortness of breath.   Hematologic/Lymphatic: Negative for bleeding problem.  Musculoskeletal:  Negative for muscle cramps and  myalgias.  Neurological:  Negative for dizziness and light-headedness.   Objective  Blood pressure 114/85, pulse 84, resp. rate 16, height 5\' 10"  (1.778 m), weight 155 lb 12.8 oz (70.7 kg), SpO2 98 %.     01/27/2023   11:22 AM 01/20/2022    8:37 AM 01/20/2021    9:59 AM  Vitals with BMI  Height 5\' 10"  5\' 10"  5\' 10"   Weight 155 lbs 13 oz 152 lbs 13 oz 156 lbs  BMI 22.35 21.92 22.38  Systolic 114 117 914  Diastolic 85 79 84  Pulse 84 87 78     Physical Exam Constitutional:      General: He is not in acute distress.    Appearance: He is well-developed.  Cardiovascular:     Rate and Rhythm: Normal rate and regular rhythm.     Pulses: Normal pulses and intact distal pulses.     Heart sounds: Murmur heard.     Harsh holosystolic murmur is present with a grade of 3/6 at the lower left sternal border radiating to the lower right sternal border.     No gallop.     Comments:   Pulmonary:     Effort: Pulmonary effort is normal. No accessory muscle usage or respiratory distress.     Breath sounds: Normal breath sounds.  Musculoskeletal:     Right lower leg: No edema.     Left lower leg: No edema.    Laboratory examination:   External labs:  09/18/2021: BUN 12, creatinine 1.1, GFR >60, potassium  4.9, sodium 145 Hgb 15.8, HCT 46.0, MCV 83.2, platelet 258 Total cholesterol 208, HDL 40, LDL 142, triglycerides 120 TSH 0.90  08/28/2020: Glucose 94, BUN 13, creatinine 1.0, GFR 85.5, sodium 140, potassium 3.8, AST 20, ALT 24, alk phos 92 Hemoglobin 15.4, hematocrit 44.5, MCV 85.4, platelet 206  Allergies  No Known Allergies   Medications Prior to Visit:   Outpatient Medications Prior to Visit  Medication Sig Dispense Refill   amitriptyline (ELAVIL) 25 MG tablet Take 25 mg by mouth at bedtime.     famotidine (PEPCID) 10 MG tablet Take 10 mg by mouth 2 (two) times daily.     metoprolol tartrate (LOPRESSOR) 25 MG tablet Take 1 tablet by mouth as needed. For palpitations     No  facility-administered medications prior to visit.   Final Medications at End of Visit    Current Meds  Medication Sig   amitriptyline (ELAVIL) 25 MG tablet Take 25 mg by mouth at bedtime.    Radiology:   No results found.  Cardiac Studies:   Event Monitor for 10 days Start date 01/08/2020-01/14/2020:  Predominant rhythm is normal sinus rhythm.  Minimum heart rate 50, average heart rate 85, maximum heart rate 178 bpm. Ventricular ectopy 5386, burden <1%.  There were 4 ventricular couplets. Supraventricular ectopy 2672.  SVT burden <0.5%.  There are rare rare atrial tachycardia longest being 6 beats. Patient activated events revealed occasional PVCs.  There is no SVT, no atrial fibrillation.  Echocardiogram 01/18/2023:  Left ventricle cavity is normal in size and wall thickness. Normal LV systolic function with visual EF 55-60%. Normal global wall motion. Normal diastolic filling pattern. Small membranous ventricular septal defect with left-to-right shunt.  Left atrial cavity is normal in size.  Aneurysmal interatrial septum. Cannot exclude atrial septal defect/PFO.  Unable to accurately evaluate severity of tricuspid regurgitation due to admixture of TR jet and VSD jet. Consider TEE, if clinically indicated. Estimated RA pressure 8 mmHg.  No significant change compared to previous study on 01/2020.   EKG  Jan 27, 2023: Sinus rhythm, 76 bpm, LAE, early repolarization, without underlying injury pattern.  Assessment     ICD-10-CM   1. PSVT (paroxysmal supraventricular tachycardia)  I47.10 EKG 12-Lead    2. Ventricular septal defect (VSD), membranous  Q21.0 EKG 12-Lead       No orders of the defined types were placed in this encounter.   Medications Discontinued During This Encounter  Medication Reason   famotidine (PEPCID) 10 MG tablet    metoprolol tartrate (LOPRESSOR) 25 MG tablet     Recommendations:   Eugene Aguilar  is a 37 y.o. Caucasian male with history of very  small membranous VSD since childhood, Hx of PSVT, last sustained episode was in 2020.   Ventricular septal defect (VSD), membranous Noted since childhood. No change in overall functional capacity. Most recent echocardiogram remains stable when compared to prior study. If change in clinical status may consider either TEE or cardiac MRI for further evaluation. Monitor for now  PSVT (paroxysmal supraventricular tachycardia) History of PSVT. No sustained episodes since last visit. Used to use metoprolol on appearing basis-no longer has a prescription and does not request a refill. He uses his iWatch for long-term monitoring.  Otherwise no changes warranted at this time.  Follow-up in 1 year or sooner if needed.  Tessa Lerner, [place], [facility]  Pager:  [phone] Office: [phone]

## 2024-01-29 ENCOUNTER — Ambulatory Visit: Payer: 59 | Admitting: Cardiology

## 2024-07-04 ENCOUNTER — Telehealth: Payer: Self-pay | Admitting: Cardiology

## 2024-07-04 NOTE — Telephone Encounter (Signed)
 Patient wants to get orders for Echocardiogram

## 2024-07-04 NOTE — Telephone Encounter (Signed)
 Spoke with pt, he said his situation had been resolved and he has an appointment in January. He does not need anything at this time.

## 2024-07-06 ENCOUNTER — Emergency Department (HOSPITAL_BASED_OUTPATIENT_CLINIC_OR_DEPARTMENT_OTHER): Admitting: Radiology

## 2024-07-06 ENCOUNTER — Emergency Department (HOSPITAL_BASED_OUTPATIENT_CLINIC_OR_DEPARTMENT_OTHER)
Admission: EM | Admit: 2024-07-06 | Discharge: 2024-07-06 | Disposition: A | Discharge status: Home / Self Care | Attending: Emergency Medicine | Admitting: Emergency Medicine

## 2024-07-06 ENCOUNTER — Encounter (HOSPITAL_BASED_OUTPATIENT_CLINIC_OR_DEPARTMENT_OTHER): Payer: Self-pay

## 2024-07-06 ENCOUNTER — Other Ambulatory Visit: Payer: Self-pay

## 2024-07-06 DIAGNOSIS — R079 Chest pain, unspecified: Secondary | ICD-10-CM | POA: Diagnosis present

## 2024-07-06 DIAGNOSIS — R200 Anesthesia of skin: Secondary | ICD-10-CM | POA: Insufficient documentation

## 2024-07-06 DIAGNOSIS — Z8774 Personal history of (corrected) congenital malformations of heart and circulatory system: Secondary | ICD-10-CM | POA: Insufficient documentation

## 2024-07-06 DIAGNOSIS — R072 Precordial pain: Secondary | ICD-10-CM | POA: Insufficient documentation

## 2024-07-06 LAB — BASIC METABOLIC PANEL WITH GFR
Anion gap: 11 (ref 5–15)
BUN: 14 mg/dL (ref 6–20)
CO2: 26 mmol/L (ref 22–32)
Calcium: 9.5 mg/dL (ref 8.9–10.3)
Chloride: 102 mmol/L (ref 98–111)
Creatinine, Ser: 1.11 mg/dL (ref 0.61–1.24)
GFR, Estimated: >60 mL/min (ref >60)
Glucose, Bld: 124 mg/dL — ABNORMAL HIGH (ref 70–99)
Potassium: 4 mmol/L (ref 3.5–5.1)
Sodium: 139 mmol/L (ref 135–145)

## 2024-07-06 LAB — CBC
HCT: 44 % (ref 39.0–52.0)
Hemoglobin: 14.7 g/dL (ref 13.0–17.0)
MCH: 29.3 pg (ref 26.0–34.0)
MCHC: 33.4 g/dL (ref 30.0–36.0)
MCV: 87.6 fL (ref 80.0–100.0)
Platelets: 212 K/uL (ref 150–400)
RBC: 5.02 MIL/uL (ref 4.22–5.81)
RDW: 12.3 % (ref 11.5–15.5)
WBC: 4.3 K/uL (ref 4.0–10.5)
nRBC: 0 % (ref 0.0–0.2)

## 2024-07-06 LAB — TROPONIN T, HIGH SENSITIVITY: Troponin T High Sensitivity: <15 ng/L (ref 0–19)

## 2024-07-06 LAB — D-DIMER, QUANTITATIVE: D-Dimer, Quant: <0.27 ug{FEU}/mL (ref 0.00–0.50)

## 2024-07-06 MED ORDER — FAMOTIDINE 20 MG PO TABS
20.0000 mg | ORAL_TABLET | Freq: Once | ORAL | Status: AC
  Administered 2024-07-06: 20 mg via ORAL
  Filled 2024-07-06: qty 1

## 2024-07-06 MED ORDER — ALUM & MAG HYDROXIDE-SIMETH 200-200-20 MG/5ML PO SUSP
30.0000 mL | Freq: Once | ORAL | Status: AC
  Administered 2024-07-06: 30 mL via ORAL
  Filled 2024-07-06: qty 30

## 2024-07-06 MED ORDER — ACETAMINOPHEN 500 MG PO TABS
1000.0000 mg | ORAL_TABLET | Freq: Once | ORAL | Status: AC
  Administered 2024-07-06: 1000 mg via ORAL
  Filled 2024-07-06: qty 2

## 2024-07-06 NOTE — ED Triage Notes (Signed)
 Arrives ambulatory to the ED with complaints of worsening left side chest pain (tightness) for one week. Patient also reports tingling/numbness in his left arm as well.

## 2024-07-06 NOTE — ED Provider Notes (Signed)
 Eugene Aguilar EMERGENCY DEPARTMENT AT [facility] Provider Note   CSN: [id number] Arrival date & time: 07/06/24  9243     Patient presents with: Chest Pain and Numbness (Left arm)   Eugene Aguilar is a 38 y.o. male.   Pt with c/o mid to left chest pain in past 5-6 days. Constant, dull, non radiating, at rest, not pleuritic, not worse w exertion. No neck, back or flank pain. No associated sob, unusual doe, fatigue, diaphoresis or nv. No chest wall injury or strain. No heartburn. No cough or uri symptoms. No fever or chills. Chest pain without specific exacerbating or alleviating factors. Not worse w exertion. No change whether upright or supine. No syncope. No palpitations. Hx vsd - gets echo periodically, last was last year. No fam hx premature cad. Non smoker. No PE hx. No recent surgery, immobility, or trauma.   The history is provided by the patient and medical records.  Chest Pain Associated symptoms: no abdominal pain, no back pain, no cough, no fever, no headache, no nausea, no palpitations, no shortness of breath and no vomiting        Prior to Admission medications   Medication Sig Start Date End Date Taking? Authorizing Provider  amitriptyline (ELAVIL) 25 MG tablet Take 25 mg by mouth at bedtime. 01/08/20   [provider]    Allergies: Patient has no known allergies.    Review of Systems  Constitutional:  Negative for chills and fever.  HENT:  Negative for sore throat.   Respiratory:  Negative for cough and shortness of breath.   Cardiovascular:  Positive for chest pain. Negative for palpitations and leg swelling.  Gastrointestinal:  Negative for abdominal pain, nausea and vomiting.  Genitourinary:  Negative for flank pain.  Musculoskeletal:  Negative for back pain and neck pain.  Skin:  Negative for rash.  Neurological:  Negative for headaches.    Updated Vital Signs BP 129/85 (BP Location: Right Arm)   Pulse (!) 102   Temp 98.2 F (36.8 C)  (Oral)   Resp 20   Ht 1.753 m (5' 9)   Wt 72.1 kg   SpO2 100%   BMI 23.48 kg/m   Physical Exam Vitals and nursing note reviewed.  Constitutional:      Appearance: Normal appearance. He is well-developed.  HENT:     Head: Atraumatic.     Nose: Nose normal.     Mouth/Throat:     Mouth: Mucous membranes are moist.     Pharynx: Oropharynx is clear.  Eyes:     General: No scleral icterus.    Conjunctiva/sclera: Conjunctivae normal.  Neck:     Trachea: No tracheal deviation.     Comments: Trachea midline. No masses.  Cardiovascular:     Rate and Rhythm: Normal rate and regular rhythm.     Pulses: Normal pulses.     Heart sounds: Murmur heard.     No friction rub. No gallop.  Pulmonary:     Effort: Pulmonary effort is normal. No accessory muscle usage or respiratory distress.     Breath sounds: Normal breath sounds.  Abdominal:     General: Bowel sounds are normal. There is no distension.     Palpations: Abdomen is soft.     Tenderness: There is no abdominal tenderness.  Musculoskeletal:        General: No swelling or tenderness.     Cervical back: Normal range of motion and neck supple. No rigidity.  Right lower leg: No edema.     Left lower leg: No edema.  Skin:    General: Skin is warm and dry.     Findings: No rash.  Neurological:     Mental Status: He is alert.     Comments: Alert, speech clear.   Psychiatric:        Mood and Affect: Mood normal.     (all labs ordered are listed, but only abnormal results are displayed) Results for orders placed or performed during the hospital encounter of 07/06/24  Basic metabolic panel   Collection Time: 07/06/24  8:06 AM  Result Value Ref Range   Sodium 139 135 - 145 mmol/L   Potassium 4.0 3.5 - 5.1 mmol/L   Chloride 102 98 - 111 mmol/L   CO2 26 22 - 32 mmol/L   Glucose, Bld 124 (H) 70 - 99 mg/dL   BUN 14 6 - 20 mg/dL   Creatinine, Ser 8.88 0.61 - 1.24 mg/dL   Calcium 9.5 8.9 - 89.6 mg/dL   GFR, Estimated >39 >39  mL/min   Anion gap 11 5 - 15  CBC   Collection Time: 07/06/24  8:06 AM  Result Value Ref Range   WBC 4.3 4.0 - 10.5 K/uL   RBC 5.02 4.22 - 5.81 MIL/uL   Hemoglobin 14.7 13.0 - 17.0 g/dL   HCT 55.9 60.9 - 47.9 %   MCV 87.6 80.0 - 100.0 fL   MCH 29.3 26.0 - 34.0 pg   MCHC 33.4 30.0 - 36.0 g/dL   RDW 87.6 88.4 - 84.4 %   Platelets 212 150 - 400 K/uL   nRBC 0.0 0.0 - 0.2 %  D-dimer, quantitative   Collection Time: 07/06/24  8:06 AM  Result Value Ref Range   D-Dimer, Quant <0.27 0.00 - 0.50 ug/mL-FEU  Troponin T, High Sensitivity   Collection Time: 07/06/24  8:06 AM  Result Value Ref Range   Troponin T High Sensitivity <15 0 - 19 ng/L     EKG: EKG Interpretation Date/Time:  Saturday July 06 2024 08:02:17 EDT Ventricular Rate:  96 PR Interval:  158 QRS Duration:  92 QT Interval:  350 QTC Calculation: 443 R Axis:   24  Text Interpretation: Sinus rhythm Confirmed by Bernard Drivers ([id number]) on 07/06/2024 8:20:56 AM  Radiology: [facility] Chest 2 View Result Date: 07/06/2024 EXAM: 2 VIEW(S) XRAY OF THE CHEST 07/06/2024 08:21:37 AM COMPARISON: None available. CLINICAL HISTORY: chest pain FINDINGS: LUNGS AND PLEURA: No focal pulmonary opacity. No pulmonary edema. No pleural effusion. No pneumothorax. HEART AND MEDIASTINUM: No acute abnormality of the cardiac and mediastinal silhouettes. BONES AND SOFT TISSUES: No acute osseous abnormality. IMPRESSION: 1. Normal chest radiograph. Electronically signed by: Waddell Calk MD 07/06/2024 08:47 AM EDT RP Workstation: [id number]     Procedures   Medications Ordered in the ED - No data to display                                  Medical Decision Making Problems Addressed: History of ventricular septal defect: chronic illness or injury that poses a threat to life or bodily functions Precordial chest pain: acute illness or injury with systemic symptoms that poses a threat to life or bodily functions  Amount and/or Complexity of Data  Reviewed External Data Reviewed: notes. Labs: ordered. Decision-making details documented in ED Course. Radiology: ordered and independent interpretation performed. Decision-making details documented in ED Course. ECG/medicine  tests: ordered and independent interpretation performed. Decision-making details documented in ED Course.  Risk OTC drugs. Decision regarding hospitalization.   Iv ns. Continuous pulse ox and cardiac monitoring. Labs ordered/sent. Imaging ordered.   Differential diagnosis includes acs, msk cp, gi cp, etc. Dispo decision including potential need for admission considered - will get labs and imaging and reassess.   Reviewed nursing notes and prior charts for additional history. External reports reviewed.   Cardiac monitor: sinus rhythm, rate 88  Labs reviewed/interpreted by me - wbc and hgb normal. Chem unremarkable. Trop normal - after constant symptoms in past several days felt not c/w acs (and as constant prolonged symptoms, normal, feel delta trop not helpful). Ddimer normal.   Meds for symptoms given.   Xrays reviewed/interpreted by me - no pna.   Recheck, pt comfortable, no pain, sob or acute distress. Currently on monitor, hr 74, rr 14, pulse ox 100% room air.  Pt currently appears stable for ED d/c.   Rec close pcp/cardiology f/u.  Return precautions provided.       Final diagnoses:  None    ED Discharge Orders     None          Bernard Drivers, MD 07/06/24 [phone]

## 2024-07-06 NOTE — Discharge Instructions (Signed)
 It was our pleasure to provide your ER care today - we hope that you feel better.  If GI/reflux symptoms, try taking pepcid and maalox as need for symptom relief.   For recent chest pain, follow up closely with cardiologist in the next 1-2 weeks - we made referral, and they should be contacting you with an appointment in the next few days.   Return to ER right away if worse, new symptoms, fevers, recurrent/persistent chest pain, increased trouble breathing, or other concern.

## 2024-09-26 ENCOUNTER — Ambulatory Visit: Payer: Self-pay | Attending: Cardiology | Admitting: Cardiology

## 2024-09-26 ENCOUNTER — Encounter: Payer: Self-pay | Admitting: Cardiology

## 2024-09-26 VITALS — BP 116/76 | HR 88 | Resp 16 | Ht 69.0 in | Wt 160.0 lb

## 2024-09-26 DIAGNOSIS — R002 Palpitations: Secondary | ICD-10-CM

## 2024-09-26 DIAGNOSIS — Q210 Ventricular septal defect: Secondary | ICD-10-CM | POA: Diagnosis not present

## 2024-09-26 DIAGNOSIS — E7800 Pure hypercholesterolemia, unspecified: Secondary | ICD-10-CM | POA: Diagnosis not present

## 2024-09-26 NOTE — Progress Notes (Signed)
 " Cardiology Office Note:  .   Date:  09/26/2024  ID:  Eugene Aguilar, DOB 13-Aug-1986, MRN [id number] PCP: Eugene Hamilton, MD  [facility] Providers Cardiologist:  None   History of Present Illness: Eugene   [name] is a 39 y.o. Caucasian male with history of very small membranous VSD since childhood, Hx of PSVT, last sustained episode was in 2020.  Patient was seen in the emergency room for what appears to be musculoskeletal chest pain on 07/06/2024, chest x-ray was normal labs are within normal limits.  Discharged home.   Patient presents today for 1 year follow-up visit given his known history of nonobstructive membranous VSD and history of brief SVT noted on prior event monitor which turned out to be brief atrial tachycardia episodes of 6 beats.  He has occasional palpitations but otherwise remains asymptomatic.Eugene    Discussed the use of AI scribe software for clinical note transcription with the patient, who gave verbal consent to proceed.  History of Present Illness Eugene Aguilar is a 39 year old male with hypercholesterolemia and ventricular septal defect who presents for cardiovascular follow-up after an emergency room visit for chest pain.  He was seen in the emergency room on October 25th for chest pain. EKG and blood work were normal, and he has had no recurrent chest pain since.  He has hypercholesterolemia with recent labs on September 29, 2023 showing total cholesterol 229 mg/dL, triglycerides 822 mg/dL, HDL 41 mg/dL, and LDL 846 mg/dL. He is largely sedentary due to remote work.  He has a small ventricular septal defect that has remained stable on echocardiograms since 2021.  He has had intermittent benign palpitations in the past, described as extra beats or brief atrial tachycardia. He occasionally notes a transient heart rate increase with pressure and need to burp that resolves after burping. He currently has no chest pain, heart racing, or significant  palpitations.  Cardiac Studies relevent.    Cardiac Studies & Procedures   ______________________________________________________________________________________________   ECHOCARDIOGRAM COMPLETE 01/18/2023 Left ventricle cavity is normal in size and wall thickness. Normal LV systolic function with visual EF 55-60%. Normal global wall motion. Normal diastolic filling pattern. Small membranous ventricular septal defect with left-to-right shunt. Left atrial cavity is normal in size. Aneurysmal interatrial septum. Cannot exclude atrial septal defect/PFO. Unable to accurately evaluate severity of tricuspid regurgitation due to admixture of TR jet and VSD jet. Consider TEE, if clinically indicated. Estimated RA pressure 8 mmHg. No significant change compared to previous study on 01/2020.   LONG TERM MONITOR-LIVE TELEMETRY (3-14 DAYS) 01/08/2020 Event Monitor for 10 days Start date 01/08/2020-01/14/2020: Predominant rhythm is normal sinus rhythm.  Minimum heart rate 50, average heart rate 85, maximum heart rate 178 bpm. Ventricular ectopy 5386, burden <1%.  There were 4 ventricular couplets. Supraventricular ectopy 2672.  SVT burden <0.5%.  There are rare rare atrial tachycardia longest being 6 beats. Patient activated events revealed occasional PVCs.  There is no SVT, no atrial fibrillation.   ______________________________________________________________________________________________    EKG:     EKG 07/06/2024: Normal sinus rhythm at rate of 96 bpm.  Normal EKG. Labs    Recent Labs    07/06/24 0806  NA 139  K 4.0  CL 102  CO2 26  GLUCOSE 124*  BUN 14  CREATININE 1.11  CALCIUM 9.5  GFRNONAA >60        Latest Ref Rng & Units 07/06/2024    8:06 AM 03/01/2016    2:36  PM  CBC  WBC 4.0 - 10.5 K/uL 4.3  4.2   Hemoglobin 13.0 - 17.0 g/dL 85.2  84.3   Hematocrit 39.0 - 52.0 % 44.0  47.2   Platelets 150 - 400 K/uL 212  196    No results found for: HGBA1C  Lab Results   Component Value Date   TSH 1.23 03/01/2016     ROS  Review of Systems  Cardiovascular:  Positive for palpitations (rare and brief). Negative for chest pain, dyspnea on exertion and leg swelling.   Physical Exam:   VS:  BP 116/76 (BP Location: Left Arm, Patient Position: Sitting, Cuff Size: Normal)   Pulse 88   Ht 5' 9 (1.753 m)   Wt 160 lb (72.6 kg)   SpO2 99%   BMI 23.63 kg/m    Wt Readings from Last 3 Encounters:  09/26/24 160 lb (72.6 kg)  07/06/24 159 lb (72.1 kg)  01/27/23 155 lb 12.8 oz (70.7 kg)    BP Readings from Last 3 Encounters:  09/26/24 116/76  07/06/24 112/79  01/27/23 114/85   Physical Exam Neck:     Vascular: No JVD.  Cardiovascular:     Rate and Rhythm: Normal rate and regular rhythm.     Pulses: Intact distal pulses.     Heart sounds: S1 normal and S2 normal. Murmur heard.     Mid to late systolic murmur is present with a grade of 3/6 at the lower left sternal border.     No gallop.  Pulmonary:     Effort: Pulmonary effort is normal.     Breath sounds: Normal breath sounds.  Abdominal:     General: Bowel sounds are normal.     Palpations: Abdomen is soft.  Musculoskeletal:     Right lower leg: No edema.     Left lower leg: No edema.     ASSESSMENT AND PLAN: .      ICD-10-CM   1. Ventricular septal defect (VSD), membranous  Q21.0     2. PSVT (paroxysmal supraventricular tachycardia)  I47.10     3. Hypercholesterolemia  E78.00      Assessment & Plan Hypercholesterolemia Chronic hypercholesterolemia with LDL at 153 mg/dL, total cholesterol at 770 mg/dL, and triglycerides at 822 mg/dL. HDL is low at 41 mg/dL. Increased risk of heart disease due to high cholesterol levels. No current symptoms of heart disease. - Recommended starting a statin due to LDL levels between 150-160 mg/dL. - Encouraged regular exercise to improve cholesterol levels and reduce heart disease risk.  Ventricular septal defect, membranous Small membranous  ventricular septal defect with no significant change in ventricular size over the past four to five years. No symptoms or complications. Echocardiogram from 2024 showed normal ventricular size with no change from 2021. No need for antibiotics before dental procedures. - No intervention required for VSD. - No need for repeat echocardiogram. - No need for antibiotics before dental procedures.  Palpitations - Brief occasional skipped beats, event monitor revealing no atrial fibrillation, no PSVT, brief 6 beats of atrial tachycardia. - No therapy indicated and no follow-up indicated.   Follow up: PRN, 5 years  Signed,  Gordy Bergamo, MD, [facility] 09/26/2024, 4:51 PM [facility] [address] [place], [place] [address] Phone: [phone]. Fax:  [phone]  "

## 2024-09-26 NOTE — Patient Instructions (Signed)
 Medication Instructions:  Your physician recommends that you continue on your current medications as directed. Please refer to the Current Medication list given to you today.   *If you need a refill on your cardiac medications before your next appointment, please call your pharmacy*   Follow-Up: At [facility], you and your health needs are our priority.  As part of our continuing mission to provide you with exceptional heart care, our providers are all part of one team.  This team includes your primary Cardiologist (physician) and Advanced Practice Providers or APPs (Physician Assistants and Nurse Practitioners) who all work together to provide you with the care you need, when you need it.  Your next appointment:    As Needed   Provider:   Gordy Bergamo, MD       We recommend signing up for the patient portal called MyChart.  Patients are able to view lab/test results, encounter notes, upcoming appointments, etc.  Non-urgent messages can be sent to your provider as well, go to [email or web address]
